# Patient Record
Sex: Female | Born: 1985 | Race: Black or African American | Hispanic: No | Marital: Single | Smoking: Never smoker
Health system: Southern US, Community
[De-identification: ages and names within clinical notes are randomized; demographics above are authoritative.]

## PROBLEM LIST (undated history)

## (undated) DIAGNOSIS — I10 Essential (primary) hypertension: Secondary | ICD-10-CM

## (undated) HISTORY — PX: ABDOMINAL HYSTERECTOMY: SHX81

## (undated) HISTORY — PX: TONSILLECTOMY: SUR1361

## (undated) HISTORY — DX: Essential (primary) hypertension: I10

---

## 2011-09-30 ENCOUNTER — Encounter (HOSPITAL_BASED_OUTPATIENT_CLINIC_OR_DEPARTMENT_OTHER): Payer: Self-pay | Admitting: *Deleted

## 2011-09-30 ENCOUNTER — Emergency Department (INDEPENDENT_AMBULATORY_CARE_PROVIDER_SITE_OTHER): Payer: Self-pay

## 2011-09-30 ENCOUNTER — Emergency Department (HOSPITAL_BASED_OUTPATIENT_CLINIC_OR_DEPARTMENT_OTHER)
Admission: EM | Admit: 2011-09-30 | Discharge: 2011-09-30 | Disposition: A | Discharge status: Home / Self Care | Payer: Self-pay | Attending: Emergency Medicine | Admitting: Emergency Medicine

## 2011-09-30 DIAGNOSIS — R109 Unspecified abdominal pain: Secondary | ICD-10-CM | POA: Insufficient documentation

## 2011-09-30 DIAGNOSIS — E860 Dehydration: Secondary | ICD-10-CM | POA: Insufficient documentation

## 2011-09-30 DIAGNOSIS — K5900 Constipation, unspecified: Secondary | ICD-10-CM | POA: Insufficient documentation

## 2011-09-30 LAB — CBC
MCV: 82.7 fL (ref 78.0–100.0)
Platelets: 296 10*3/uL (ref 150–400)
RBC: 4.68 MIL/uL (ref 3.87–5.11)
RDW: 13.1 % (ref 11.5–15.5)
WBC: 7.1 10*3/uL (ref 4.0–10.5)

## 2011-09-30 LAB — COMPREHENSIVE METABOLIC PANEL
ALT: 8 U/L (ref 0–35)
AST: 16 U/L (ref 0–37)
Albumin: 4 g/dL (ref 3.5–5.2)
CO2: 23 mEq/L (ref 19–32)
Chloride: 103 mEq/L (ref 96–112)
Creatinine, Ser: 0.6 mg/dL (ref 0.50–1.10)
Sodium: 136 mEq/L (ref 135–145)
Total Bilirubin: 0.4 mg/dL (ref 0.3–1.2)

## 2011-09-30 LAB — DIFFERENTIAL
Basophils Relative: 0 % (ref 0–1)
Eosinophils Relative: 2 % (ref 0–5)
Lymphocytes Relative: 35 % (ref 12–46)
Monocytes Absolute: 0.5 10*3/uL (ref 0.1–1.0)
Monocytes Relative: 8 % (ref 3–12)
Neutro Abs: 3.9 10*3/uL (ref 1.7–7.7)

## 2011-09-30 LAB — URINALYSIS, ROUTINE W REFLEX MICROSCOPIC
Leukocytes, UA: NEGATIVE
Nitrite: NEGATIVE
Specific Gravity, Urine: 1.026 (ref 1.005–1.030)
Urobilinogen, UA: 1 mg/dL (ref 0.0–1.0)
pH: 5.5 (ref 5.0–8.0)

## 2011-09-30 LAB — PREGNANCY, URINE: Preg Test, Ur: NEGATIVE

## 2011-09-30 MED ORDER — BISACODYL 5 MG PO TBEC: 5.0000 mg | DELAYED_RELEASE_TABLET | Freq: Two times a day (BID) | ORAL | Status: AC

## 2011-09-30 MED ORDER — SODIUM CHLORIDE 0.9 % IV BOLUS (SEPSIS)
1000.0000 mL | Freq: Once | INTRAVENOUS | Status: AC
  Administered 2011-09-30: 1000 mL via INTRAVENOUS

## 2011-09-30 MED ORDER — GI COCKTAIL ~~LOC~~
30.0000 mL | Freq: Once | ORAL | Status: AC
  Administered 2011-09-30: 30 mL via ORAL
  Filled 2011-09-30: qty 30

## 2011-09-30 MED ORDER — POLYETHYLENE GLYCOL 3350 17 GM/SCOOP PO POWD: 17.0000 g | Freq: Two times a day (BID) | ORAL | Status: AC

## 2011-09-30 MED ORDER — FAMOTIDINE IN NACL 20-0.9 MG/50ML-% IV SOLN
20.0000 mg | Freq: Once | INTRAVENOUS | Status: AC
  Administered 2011-09-30: 20 mg via INTRAVENOUS
  Filled 2011-09-30: qty 50

## 2011-09-30 NOTE — ED Provider Notes (Signed)
History     CSN: [id number]  Arrival date & time 09/30/11  1438   First MD Initiated Contact with Patient 09/30/11 1531      Chief Complaint  Patient presents with  . Abdominal Cramping    (Consider location/radiation/quality/duration/timing/severity/associated sxs/prior treatment) Patient is a 26 y.o. female presenting with cramps. The history is provided by the patient. No language interpreter was used.  Abdominal Cramping The primary symptoms of the illness include abdominal pain. The primary symptoms of the illness do not include fever, fatigue, shortness of breath, nausea, vomiting, diarrhea or dysuria. The current episode started 2 days ago. The onset of the illness was gradual. The problem has been gradually worsening.  The abdominal pain began 2 days ago. The pain came on gradually. The abdominal pain has been gradually worsening since its onset. The abdominal pain is generalized. The abdominal pain does not radiate. The abdominal pain is relieved by nothing. Exacerbated by: nothing.  The patient states that she believes she is currently not pregnant. The patient has had a change in bowel habit. Additional symptoms associated with the illness include constipation. Symptoms associated with the illness do not include chills, anorexia, urgency, frequency or back pain.    History reviewed. No pertinent past medical history.  Past Surgical History  Procedure Date  . Tonsillectomy     History reviewed. No pertinent family history.  History  Substance Use Topics  . Smoking status: Never Smoker   . Smokeless tobacco: Not on file  . Alcohol Use: No    OB History    Grav Para Term Preterm Abortions TAB SAB Ect Mult Living                  Review of Systems  Constitutional: Negative for fever, chills, activity change, appetite change and fatigue.  HENT: Negative for congestion, sore throat, rhinorrhea, neck pain and neck stiffness.   Respiratory: Negative for cough and  shortness of breath.   Cardiovascular: Negative for chest pain and palpitations.  Gastrointestinal: Positive for abdominal pain and constipation. Negative for nausea, vomiting, diarrhea, blood in stool and anorexia.  Genitourinary: Negative for dysuria, urgency, frequency and flank pain.  Musculoskeletal: Negative for myalgias, back pain and arthralgias.  Neurological: Negative for dizziness, weakness, light-headedness, numbness and headaches.  All other systems reviewed and are negative.    Allergies  Review of patient's allergies indicates no known allergies.  Home Medications   Current Outpatient Rx  Name Route Sig Dispense Refill  . IBUPROFEN 200 MG PO TABS Oral Take 400 mg by mouth every 6 (six) hours as needed. Patient used this medication for her stomach pain.    Marland Kitchen BISACODYL 5 MG PO TBEC Oral Take 1 tablet (5 mg total) by mouth 2 (two) times daily. 14 tablet 0  . POLYETHYLENE GLYCOL 3350 PO POWD Oral Take 17 g by mouth 2 (two) times daily. 255 g 0    BP 118/73  Pulse 73  Temp(Src) 98.7 F (37.1 C) (Oral)  Resp 16  Ht 5\' 2"  (1.575 m)  Wt 143 lb (64.864 kg)  BMI 26.15 kg/m2  SpO2 100%  LMP 09/14/2011  Physical Exam  Nursing note and vitals reviewed. Constitutional: She is oriented to person, place, and time. She appears well-developed and well-nourished. No distress.  HENT:  Head: Normocephalic and atraumatic.  Mouth/Throat: Oropharynx is clear and moist. No oropharyngeal exudate.  Eyes: Conjunctivae and EOM are normal. Pupils are equal, round, and reactive to light.  Neck: Normal range  of motion. Neck supple.  Cardiovascular: Normal rate, regular rhythm, normal heart sounds and intact distal pulses.  Exam reveals no gallop and no friction rub.   No murmur heard. Pulmonary/Chest: Effort normal and breath sounds normal. No respiratory distress. She exhibits no tenderness.  Abdominal: Soft. Bowel sounds are normal. She exhibits no distension. There is tenderness  (epigastric tenderness). There is no rebound and no guarding.  Musculoskeletal: Normal range of motion. She exhibits no edema and no tenderness.  Neurological: She is alert and oriented to person, place, and time. No cranial nerve deficit.  Skin: Skin is warm and dry. No rash noted.    ED Course  Procedures (including critical care time)  Labs Reviewed  URINALYSIS, ROUTINE W REFLEX MICROSCOPIC - Abnormal; Notable for the following:    Ketones, ur 40 (*)    All other components within normal limits  PREGNANCY, URINE  CBC  COMPREHENSIVE METABOLIC PANEL  DIFFERENTIAL   Dg Abd 1 View  09/30/2011  *RADIOLOGY REPORT*  Clinical Data: Abdominal cramping for past 2 days.  ABDOMEN - 1 VIEW  Comparison: No priors.  Findings: Gas and stool is seen scattered throughout the colon extending to the level of the rectum.  No pathologic distension of small bowel.  No gross evidence of pneumoperitoneum on this single supine view of the abdomen.  IMPRESSION: 1.  Nonobstructive bowel gas pattern.  No pneumoperitoneum.  Original Report Authenticated By: Florencia Reasons, M.D.     1. Constipation   2. Dehydration, mild       MDM  Will discharge home with stool softeners and miralax.  Instructed to follow up with a primary care physician.  No concern about a malignant cause of pain.  No evidence of obstruction.  Remainder of workup unremarkable.  Did administer some IVF for presence of ketones to suggest mild dehydration        Dayton Bailiff, MD 09/30/11 1649

## 2011-09-30 NOTE — ED Notes (Signed)
Pt c/o abd " cramping " x 2 days, denies n/v/d

## 2011-09-30 NOTE — Discharge Instructions (Signed)

## 2011-11-12 ENCOUNTER — Emergency Department (HOSPITAL_BASED_OUTPATIENT_CLINIC_OR_DEPARTMENT_OTHER)
Admission: EM | Admit: 2011-11-12 | Discharge: 2011-11-12 | Disposition: A | Discharge status: Home / Self Care | Payer: Self-pay | Attending: Emergency Medicine | Admitting: Emergency Medicine

## 2011-11-12 ENCOUNTER — Emergency Department (HOSPITAL_BASED_OUTPATIENT_CLINIC_OR_DEPARTMENT_OTHER): Payer: Self-pay

## 2011-11-12 ENCOUNTER — Encounter (HOSPITAL_BASED_OUTPATIENT_CLINIC_OR_DEPARTMENT_OTHER): Payer: Self-pay | Admitting: *Deleted

## 2011-11-12 DIAGNOSIS — R109 Unspecified abdominal pain: Secondary | ICD-10-CM | POA: Insufficient documentation

## 2011-11-12 DIAGNOSIS — O039 Complete or unspecified spontaneous abortion without complication: Secondary | ICD-10-CM | POA: Insufficient documentation

## 2011-11-12 DIAGNOSIS — N898 Other specified noninflammatory disorders of vagina: Secondary | ICD-10-CM | POA: Insufficient documentation

## 2011-11-12 LAB — URINE MICROSCOPIC-ADD ON

## 2011-11-12 LAB — ABO/RH: ABO/RH(D): O POS

## 2011-11-12 LAB — CBC
MCH: 29.4 pg (ref 26.0–34.0)
MCHC: 35.4 g/dL (ref 30.0–36.0)
Platelets: 293 10*3/uL (ref 150–400)
RDW: 13.1 % (ref 11.5–15.5)

## 2011-11-12 LAB — URINALYSIS, ROUTINE W REFLEX MICROSCOPIC
Bilirubin Urine: NEGATIVE
Glucose, UA: NEGATIVE mg/dL
Specific Gravity, Urine: 1.029 (ref 1.005–1.030)

## 2011-11-12 LAB — WET PREP, GENITAL: Trich, Wet Prep: NONE SEEN

## 2011-11-12 NOTE — Discharge Instructions (Signed)
You should have a repeat BHCG (pregnancy hormone) level checked in the next 2-4 days to see that it's decreasing appropriately as expected with a miscarriage.  If you have increasing abdominal pain, lightheadedness, large increases in bleeding or other concerns please be reevaluated in the emergency department.  Miscarriage (Spontaneous Miscarriage) A miscarriage is when you lose your baby before the twentieth week of pregnancy. Miscarriages happen in 15-20% of pregnancies. Most miscarriages happen in the first 13 weeks of the pregnancy. In medical terms, this is called a spontaneous miscarriage or early pregnancy loss. No further treatment is needed when the miscarriage is complete and all products of conception have been passed out of the body. You can begin trying for another pregnancy as soon as your caregiver says it is okay. CAUSES   Most causes are not known.   Genetic problems like abnormal, not enough or too many chromosomes.   Infection of the cervix or uterus.   An abnormal shaped uterus, fibroid tumors or congenital abnormalities.   Hormone problems.   Medical problems.   Incompetent cervix, the tissue in the cervix is not strong enough to hold the pregnancy.   Smoking, too much alcohol use and illegal drugs.   Trauma.  SYMPTOMS   Bleeding or spotting from the vagina.   Cramping of the lower abdomen.   Passing of fluid from the vagina with or without cramps or pain.   Passing fetal tissue.  TREATMENT   Sometimes no further treatment is necessary if you pass all the tissue in the uterus.   If partial parts of the fetus or placenta remain in the body (incomplete miscarriage), tissue left behind may become infected. Usually a D and C (Dilatation and Curettage) suction or scrapping of the uterus is necessary to remove the remaining tissue in uterus. The procedure is only done when your caregiver knows that there is no chance for the pregnancy to continue. This is  determined by a physical exam, a negative pregnancy test, blood tests and perhaps an ultrasound revealing a dead fetus or no fetus developing because a problem occurred at conception (when the sperm and egg unite).   Medications may be necessary, antibiotics if there is an infection or medications to contract the uterus if there is a lot of bleeding.   If you have Rh negative blood and your partner is Rh positive, you will need a Rho-gam shot (an immune globulin vaccine). This will protect your baby from having Rh blood problems in future pregnancies.  HOME CARE INSTRUCTIONS   Your caregiver may order bed rest (up to the bathroom only). He or she may allow you to continue light activity. You may need to make arrangements for the care of children and for any other responsibilities.   Keep track of the number of pads you use each day and how soaked (saturated) they are. Record this information.   Do not use tampons. Do not douche or have sexual intercourse until approved by your caregiver.   Only take over-the-counter or prescription medicines for pain, discomfort or fever as directed by your caregiver.   Do not take aspirin because it can cause bleeding.   It is very important to keep all follow-up appointments for re-evaluations and continuing management.   Tell your caregiver if you are experiencing domestic violence.   Women who have an Rh negative blood type (i.e., A, B, AB, or O negative) need to receive a drug called Rh(D) immune globulin (RhoGam). This medicine helps protect  future fetuses against problems that can occur if an Rh negative mother is carrying a baby who is Rh positive.   If you and/or your partner are having problems with guilt or grieving, talk to your caregiver or seek counseling to help you cope with the pregnancy loss. Allow enough time to grieve before trying to get pregnant again.  SEEK IMMEDIATE MEDICAL CARE IF:   You have severe cramps or pain in your stomach,  back, or belly (abdomen).   You have a fever.   You pass large clots or tissue. Save any tissue for your caregiver to inspect.   Your bleeding increases.   You become light-headed, weak or have fainting episodes.   You develop chills.  Document Released: 12/02/2000 Document Revised: 05/28/2011 Document Reviewed: 01/09/2008 [facility] Patient Information 2012 [place], [place].

## 2011-11-12 NOTE — ED Provider Notes (Signed)
History     CSN: [id number]  Arrival date & time 11/12/11  1527   First MD Initiated Contact with Patient 11/12/11 [phone]      Chief Complaint  Patient presents with  . Vaginal Bleeding    (Consider location/radiation/quality/duration/timing/severity/associated sxs/prior treatment) HPI Comments: Patient presents for onset of vaginal bleeding yesterday.  Patient states her last menstrual period was the end of March.  A week ago she states she [facility]  She's noted some intermittent spotting over the last few weeks.  Yesterday and today she's had more significant clots and some abdominal cramping.  No dysuria.  No fevers.  No nausea, vomiting or other changes in bowel habits.  Patient is concerned she is having a miscarriage.  This is the patient's first pregnancy.  Patient is a 26 y.o. female presenting with vaginal bleeding. The history is provided by the patient. No language interpreter was used.  Vaginal Bleeding This is a new problem. The current episode started yesterday. Associated symptoms include abdominal pain. Pertinent negatives include no chest pain, no headaches and no shortness of breath.    History reviewed. No pertinent past medical history.  Past Surgical History  Procedure Date  . Tonsillectomy     History reviewed. No pertinent family history.  History  Substance Use Topics  . Smoking status: Never Smoker   . Smokeless tobacco: Not on file  . Alcohol Use: No    OB History    Grav Para Term Preterm Abortions TAB SAB Ect Mult Living                  Review of Systems  Constitutional: Negative.  Negative for fever and chills.  HENT: Negative.   Eyes: Negative.  Negative for discharge and redness.  Respiratory: Negative.  Negative for cough and shortness of breath.   Cardiovascular: Negative.  Negative for chest pain.  Gastrointestinal: Positive for abdominal pain. Negative for nausea, vomiting and diarrhea.    Genitourinary: Positive for vaginal bleeding. Negative for dysuria and vaginal discharge.  Musculoskeletal: Negative.  Negative for back pain.  Skin: Negative.  Negative for color change and rash.  Neurological: Negative.  Negative for syncope and headaches.  Hematological: Negative.  Negative for adenopathy.  Psychiatric/Behavioral: Negative.  Negative for confusion.  All other systems reviewed and are negative.    Allergies  Review of patient's allergies indicates no known allergies.  Home Medications   Current Outpatient Rx  Name Route Sig Dispense Refill  . IBUPROFEN 200 MG PO TABS Oral Take 200 mg by mouth every 6 (six) hours as needed. For pain    . PRENATAL MULTIVITAMIN CH Oral Take 1 tablet by mouth daily.      BP 137/73  Pulse 75  Temp(Src) 98.1 F (36.7 C) (Oral)  Resp 18  SpO2 100%  LMP 09/12/2011  Physical Exam  Nursing note and vitals reviewed. Constitutional: She is oriented to person, place, and time. She appears well-developed and well-nourished.  Non-toxic appearance. She does not have a sickly appearance.  HENT:  Head: Normocephalic and atraumatic.  Eyes: Conjunctivae, EOM and lids are normal. Pupils are equal, round, and reactive to light. No scleral icterus.  Neck: Trachea normal and normal range of motion. Neck supple.  Cardiovascular: Normal rate, regular rhythm and normal heart sounds.   Pulmonary/Chest: Effort normal and breath sounds normal. No respiratory distress. She has no wheezes. She has no rales.  Abdominal: Soft. Normal appearance. There is no  tenderness. There is no rebound, no guarding and no CVA tenderness.  Genitourinary: No vaginal discharge found.       Examination chaperoned. Normal external genitalia.  Patient does have a small amount of blood in the vaginal vault.  No other vaginal discharge present.  Closed cervical os.  No cervical motion tenderness or adnexal tenderness on exam.   Musculoskeletal: Normal range of motion.   Neurological: She is alert and oriented to person, place, and time. She has normal strength.  Skin: Skin is warm, dry and intact. No rash noted.  Psychiatric: She has a normal mood and affect. Her behavior is normal. Judgment and thought content normal.    ED Course  Procedures (including critical care time)  Results for orders placed during the hospital encounter of 11/12/11  PREGNANCY, URINE      Component Value Range   Preg Test, Ur POSITIVE (*) NEGATIVE   URINALYSIS, ROUTINE W REFLEX MICROSCOPIC      Component Value Range   Color, Urine AMBER (*) YELLOW    APPearance CLOUDY (*) CLEAR    Specific Gravity, Urine 1.029  1.005 - 1.030    pH 5.5  5.0 - 8.0    Glucose, UA NEGATIVE  NEGATIVE (mg/dL)   Hgb urine dipstick LARGE (*) NEGATIVE    Bilirubin Urine NEGATIVE  NEGATIVE    Ketones, ur 15 (*) NEGATIVE (mg/dL)   Protein, ur 409 (*) NEGATIVE (mg/dL)   Urobilinogen, UA 1.0  0.0 - 1.0 (mg/dL)   Nitrite POSITIVE (*) NEGATIVE    Leukocytes, UA MODERATE (*) NEGATIVE   WET PREP, GENITAL      Component Value Range   Yeast Wet Prep HPF POC NONE SEEN  NONE SEEN    Trich, Wet Prep NONE SEEN  NONE SEEN    Clue Cells Wet Prep HPF POC MODERATE (*) NONE SEEN    WBC, Wet Prep HPF POC FEW (*) NONE SEEN   CBC      Component Value Range   WBC 6.1  4.0 - 10.5 (K/uL)   RBC 4.29  3.87 - 5.11 (MIL/uL)   Hemoglobin 12.6  12.0 - 15.0 (g/dL)   HCT 81.1 (*) 91.4 - 46.0 (%)   MCV 83.0  78.0 - 100.0 (fL)   MCH 29.4  26.0 - 34.0 (pg)   MCHC 35.4  30.0 - 36.0 (g/dL)   RDW 78.2  95.6 - 21.3 (%)   Platelets 293  150 - 400 (K/uL)  ABO/RH      Component Value Range   ABO/RH(D) O POS     No rh immune globuloin NOT A RH IMMUNE GLOBULIN CANDIDATE, PT RH POSITIVE    HCG, QUANTITATIVE, PREGNANCY      Component Value Range   hCG, Beta Chain, Quant, S 2232 (*) <5 (mIU/mL)  URINE MICROSCOPIC-ADD ON      Component Value Range   Squamous Epithelial / LPF MANY (*) RARE    WBC, UA 11-20  <3 (WBC/hpf)   RBC  / HPF TOO NUMEROUS TO COUNT  <3 (RBC/hpf)   Bacteria, UA MANY (*) RARE    Urine-Other MUCOUS PRESENT     US Ob Comp Less 14 Wks  11/12/2011  *RADIOLOGY REPORT*  Clinical Data: 26 year old G1, LMP 09/12/2011 (a week 5 days), presenting with vaginal bleeding including clots over the past 2 days.  Positive urine pregnancy test.  OBSTETRIC <14 WK [place] AND TRANSVAGINAL OB US  Technique:  Both transabdominal and transvaginal ultrasound examinations were performed for complete  evaluation of the gestation as well as the maternal uterus, adnexal regions, and pelvic cul-de-sac.  Transvaginal technique was performed to assess early pregnancy.  Comparison:  None.  Intrauterine gestational sac:  Not visualized. Yolk sac: Not visualized. Embryo: Not visualized. Cardiac Activity: Not visualized. Heart Rate: Not applicable.  MSD: Not applicable. CRL: Not applicable. [place] EDC: Not applicable.  Maternal uterus/adnexae: Normal appearing uterus.  Normal-appearing trilaminar endometrium. Echogenic fluid in the fundal endometrial canal.  Right ovary normal in size measuring approximately 2.9 x 1.6 x 2.1 cm, containing small follicular cysts.  Left ovary normal in size measuring 3.1 x 2.1 x 2.1 cm, containing an approximate 1.2 x 0.9 x 0.9 cm simple cyst and a smaller complex cyst.  Normal color Doppler flow in both ovaries.  No adnexal masses or free pelvic fluid.  IMPRESSION:  1.  No visible intrauterine gestational sac and no evidence of adnexal ectopic pregnancy.  Findings are compatible with completed spontaneous abortion, inaccurate dates with a very early gestation such that the sac was not yet visualized, or inapparent ectopic. Correlation with beta HCG and follow-up ultrasound may help with this determination. 2.  Normal-appearing ovaries.  No adnexal masses or free fluid. 3.  Small amount of blood in the fundal endometrial canal.  Original Report Authenticated By: Arnell Sieving, M.D.   US Ob Transvaginal  11/12/2011   *RADIOLOGY REPORT*  Clinical Data: 26 year old G1, LMP 09/12/2011 (a week 5 days), presenting with vaginal bleeding including clots over the past 2 days.  Positive urine pregnancy test.  OBSTETRIC <14 WK [place] AND TRANSVAGINAL OB US  Technique:  Both transabdominal and transvaginal ultrasound examinations were performed for complete evaluation of the gestation as well as the maternal uterus, adnexal regions, and pelvic cul-de-sac.  Transvaginal technique was performed to assess early pregnancy.  Comparison:  None.  Intrauterine gestational sac:  Not visualized. Yolk sac: Not visualized. Embryo: Not visualized. Cardiac Activity: Not visualized. Heart Rate: Not applicable.  MSD: Not applicable. CRL: Not applicable. [place] EDC: Not applicable.  Maternal uterus/adnexae: Normal appearing uterus.  Normal-appearing trilaminar endometrium. Echogenic fluid in the fundal endometrial canal.  Right ovary normal in size measuring approximately 2.9 x 1.6 x 2.1 cm, containing small follicular cysts.  Left ovary normal in size measuring 3.1 x 2.1 x 2.1 cm, containing an approximate 1.2 x 0.9 x 0.9 cm simple cyst and a smaller complex cyst.  Normal color Doppler flow in both ovaries.  No adnexal masses or free pelvic fluid.  IMPRESSION:  1.  No visible intrauterine gestational sac and no evidence of adnexal ectopic pregnancy.  Findings are compatible with completed spontaneous abortion, inaccurate dates with a very early gestation such that the sac was not yet visualized, or inapparent ectopic. Correlation with beta HCG and follow-up ultrasound may help with this determination. 2.  Normal-appearing ovaries.  No adnexal masses or free fluid. 3.  Small amount of blood in the fundal endometrial canal.  Original Report Authenticated By: Arnell Sieving, M.D.      MDM  Patient with likely spontaneous abortion.  Patient does understand that there is a small possibility of ectopic or early pregnancy.  I've advised the patient that she  needs GYN followup in next 2-4 days for repeat beta quantitative level and possible ultrasound.  She understands this at time of discharge.  Patient is otherwise hemodynamically stable and has a soft abdomen consistent with the ultrasound showing no fluid.  I believe the patient is safe for discharge home.  She's been given precautions to return for worsening pain, lightheadedness, large increase in bleeding.        Nat Christen, MD 11/12/11 [phone]

## 2011-11-12 NOTE — ED Notes (Signed)
[redacted] weeks pregnant. Vaginal bleeding since yesterday and abdominal cramps. Thinks she had a miscarriage.

## 2011-11-14 LAB — GC/CHLAMYDIA PROBE AMP, GENITAL
Chlamydia, DNA Probe: NEGATIVE
GC Probe Amp, Genital: NEGATIVE

## 2012-07-24 ENCOUNTER — Encounter (HOSPITAL_BASED_OUTPATIENT_CLINIC_OR_DEPARTMENT_OTHER): Payer: Self-pay

## 2012-07-24 ENCOUNTER — Emergency Department (HOSPITAL_BASED_OUTPATIENT_CLINIC_OR_DEPARTMENT_OTHER)
Admission: EM | Admit: 2012-07-24 | Discharge: 2012-07-24 | Disposition: A | Discharge status: Home / Self Care | Payer: Medicaid Other | Attending: Emergency Medicine | Admitting: Emergency Medicine

## 2012-07-24 DIAGNOSIS — R109 Unspecified abdominal pain: Secondary | ICD-10-CM | POA: Insufficient documentation

## 2012-07-24 DIAGNOSIS — Z3202 Encounter for pregnancy test, result negative: Secondary | ICD-10-CM | POA: Insufficient documentation

## 2012-07-24 DIAGNOSIS — N390 Urinary tract infection, site not specified: Secondary | ICD-10-CM | POA: Insufficient documentation

## 2012-07-24 LAB — URINALYSIS, ROUTINE W REFLEX MICROSCOPIC
Bilirubin Urine: NEGATIVE
Nitrite: POSITIVE — AB
Specific Gravity, Urine: 1.023 (ref 1.005–1.030)
Urobilinogen, UA: 1 mg/dL (ref 0.0–1.0)

## 2012-07-24 LAB — PREGNANCY, URINE: Preg Test, Ur: NEGATIVE

## 2012-07-24 LAB — URINE MICROSCOPIC-ADD ON

## 2012-07-24 MED ORDER — CEPHALEXIN 250 MG PO CAPS
500.0000 mg | ORAL_CAPSULE | Freq: Once | ORAL | Status: AC
  Administered 2012-07-24: 500 mg via ORAL
  Filled 2012-07-24: qty 2

## 2012-07-24 MED ORDER — CEPHALEXIN 500 MG PO CAPS: 500.0000 mg | ORAL_CAPSULE | Freq: Two times a day (BID) | ORAL | Status: DC

## 2012-07-24 MED ORDER — PHENAZOPYRIDINE HCL 100 MG PO TABS
200.0000 mg | ORAL_TABLET | Freq: Once | ORAL | Status: AC
  Administered 2012-07-24: 200 mg via ORAL
  Filled 2012-07-24: qty 2

## 2012-07-24 MED ORDER — PHENAZOPYRIDINE HCL 200 MG PO TABS: 200.0000 mg | ORAL_TABLET | Freq: Three times a day (TID) | ORAL | Status: DC

## 2012-07-24 NOTE — ED Provider Notes (Signed)
History     CSN: [id number]  Arrival date & time 07/24/12  1154   First MD Initiated Contact with Patient 07/24/12 1210      Chief Complaint  Patient presents with  . Urinary Frequency    (Consider location/radiation/quality/duration/timing/severity/associated sxs/prior treatment) HPI Tracey Elliott is a 27 y.o. female who presents with complaint of urinary frequency for 2 days. States has to go urinate every 10 min with just small amount of urine out. States has burning in her abdomen only with urination, denies fever, chills, malaise, urinary urgency, cloudy urine. Denies any vaginal discharge or bleeding. States just finished her period, does not think she is pregnant. Pt states no abdominal pain, unless urinating. No n/v.    History reviewed. No pertinent past medical history.  Past Surgical History  Procedure Date  . Tonsillectomy     History reviewed. No pertinent family history.  History  Substance Use Topics  . Smoking status: Never Smoker   . Smokeless tobacco: Not on file  . Alcohol Use: No    OB History    Grav Para Term Preterm Abortions TAB SAB Ect Mult Living                  Review of Systems  Constitutional: Negative for fever and chills.  Respiratory: Negative.   Cardiovascular: Negative.   Gastrointestinal: Positive for abdominal pain. Negative for nausea, vomiting and diarrhea.  Genitourinary: Negative for dysuria, urgency, frequency, hematuria, flank pain, vaginal bleeding, vaginal discharge, difficulty urinating and genital sores.  Musculoskeletal: Negative.   Skin: Negative.     Allergies  Review of patient's allergies indicates no known allergies.  Home Medications   Current Outpatient Rx  Name  Route  Sig  Dispense  Refill  . IBUPROFEN 200 MG PO TABS   Oral   Take 400 mg by mouth every 6 (six) hours as needed. For pain         . PRENATAL MULTIVITAMIN CH   Oral   Take 1 tablet by mouth daily.           BP 131/89  Pulse 66   Temp 98 F (36.7 C) (Oral)  Resp 18  Ht 5\' 2"  (1.575 m)  Wt 150 lb (68.04 kg)  BMI 27.44 kg/m2  SpO2 100%  LMP 07/20/2012  Physical Exam  Nursing note and vitals reviewed. Constitutional: She appears well-developed and well-nourished. No distress.  Eyes: Conjunctivae normal are normal.  Neck: Neck supple.  Cardiovascular: Normal rate, regular rhythm and normal heart sounds.   Pulmonary/Chest: Effort normal and breath sounds normal. No respiratory distress. She has no wheezes. She has no rales.  Abdominal: Soft. Bowel sounds are normal. She exhibits no distension. There is no tenderness. There is no rebound.  Neurological: She is alert.  Skin: Skin is warm and dry.    ED Course  Procedures (including critical care time) Pt with urinary frequency, no abdominal tenderness on exam. Some pain with urination. Will get UA, urine preg. Pt has no vaginal symptoms and not wanting pelvic exam done unless urine comes back normal.  ' Results for orders placed during the hospital encounter of 07/24/12  PREGNANCY, URINE      Component Value Range   Preg Test, Ur NEGATIVE  NEGATIVE  URINALYSIS, ROUTINE W REFLEX MICROSCOPIC      Component Value Range   Color, Urine YELLOW  YELLOW   APPearance CLOUDY (*) CLEAR   Specific Gravity, Urine 1.023  1.005 - 1.030  pH 5.5  5.0 - 8.0   Glucose, UA NEGATIVE  NEGATIVE mg/dL   Hgb urine dipstick LARGE (*) NEGATIVE   Bilirubin Urine NEGATIVE  NEGATIVE   Ketones, ur NEGATIVE  NEGATIVE mg/dL   Protein, ur 960 (*) NEGATIVE mg/dL   Urobilinogen, UA 1.0  0.0 - 1.0 mg/dL   Nitrite POSITIVE (*) NEGATIVE   Leukocytes, UA MODERATE (*) NEGATIVE  URINE MICROSCOPIC-ADD ON      Component Value Range   Squamous Epithelial / LPF MANY (*) RARE   WBC, UA TOO NUMEROUS TO COUNT  <3 WBC/hpf   RBC / HPF TOO NUMEROUS TO COUNT  <3 RBC/hpf   Bacteria, UA MANY (*) RARE   No results found.    1. UTI (lower urinary tract infection)       MDM  TNTC WBCs, many  bacteria, positive ntirite and leukocyte esterase on UA. Will start on keflex 500mg  BID, will try pyridium for symptoms. First doses given in ED. Will d/c home with follow up. Return if worsening. No flank pain, pt afebrile, doubt pyelonephritis.    Filed Vitals:   07/24/12 1208 07/24/12 1209  BP:  131/89  Pulse:  66  Temp:  98 F (36.7 C)  TempSrc:  Oral  Resp:  18  Height: 5\' 2"  (1.575 m) 5\' 2"  (1.575 m)  Weight: 150 lb (68.04 kg) 150 lb (68.04 kg)  SpO2:  100%        Lottie Mussel, PA 07/24/12 1650

## 2012-07-24 NOTE — ED Notes (Signed)
Pt states that she has urinary frequency and lower abdominal pain.  Pt states that she does not have dysuria, fever, chills, generalized malaise or fatigue.  No other s/sx noted.

## 2012-07-25 NOTE — ED Provider Notes (Signed)
Medical screening examination/treatment/procedure(s) were performed by non-physician practitioner and as supervising physician I was immediately available for consultation/collaboration.    Ceana Fiala L Jasleen Riepe, MD 07/25/12 2229 

## 2012-07-26 LAB — URINE CULTURE

## 2012-07-27 NOTE — ED Notes (Signed)
+   Urine Patient treated with Keflex-sensitive to same-chart appended per protocol MD. 

## 2012-08-16 ENCOUNTER — Emergency Department (HOSPITAL_BASED_OUTPATIENT_CLINIC_OR_DEPARTMENT_OTHER): Payer: Medicaid Other

## 2012-08-16 ENCOUNTER — Encounter (HOSPITAL_BASED_OUTPATIENT_CLINIC_OR_DEPARTMENT_OTHER): Payer: Self-pay | Admitting: *Deleted

## 2012-08-16 ENCOUNTER — Emergency Department (HOSPITAL_BASED_OUTPATIENT_CLINIC_OR_DEPARTMENT_OTHER)
Admission: EM | Admit: 2012-08-16 | Discharge: 2012-08-16 | Disposition: A | Discharge status: Home / Self Care | Payer: Medicaid Other | Attending: Emergency Medicine | Admitting: Emergency Medicine

## 2012-08-16 DIAGNOSIS — Z3202 Encounter for pregnancy test, result negative: Secondary | ICD-10-CM | POA: Insufficient documentation

## 2012-08-16 DIAGNOSIS — R071 Chest pain on breathing: Secondary | ICD-10-CM | POA: Insufficient documentation

## 2012-08-16 LAB — URINALYSIS, ROUTINE W REFLEX MICROSCOPIC
Bilirubin Urine: NEGATIVE
Glucose, UA: NEGATIVE mg/dL
Ketones, ur: NEGATIVE mg/dL
Leukocytes, UA: NEGATIVE
Protein, ur: NEGATIVE mg/dL

## 2012-08-16 MED ORDER — IBUPROFEN 800 MG PO TABS: 800.0000 mg | ORAL_TABLET | Freq: Three times a day (TID) | ORAL | Status: DC

## 2012-08-16 MED ORDER — HYDROCODONE-ACETAMINOPHEN 5-325 MG PO TABS: 2.0000 | ORAL_TABLET | ORAL | Status: DC | PRN

## 2012-08-16 NOTE — ED Provider Notes (Signed)
History     CSN: [id number]  Arrival date & time 08/16/12  1517   First MD Initiated Contact with Patient 08/16/12 1734      Chief Complaint  Patient presents with  . Chest Pain    (Consider location/radiation/quality/duration/timing/severity/associated sxs/prior treatment) Patient is a 27 y.o. female presenting with chest pain. The history is provided by the patient. No language interpreter was used.  Chest Pain Pain location:  L chest Pain quality: sharp and stabbing   Pain radiates to:  Does not radiate Pain radiates to the back: no   Pain severity:  No pain Onset quality:  Sudden Duration:  1 minute Progression:  Unchanged Chronicity:  New Relieved by:  Nothing Worsened by:  Nothing tried Associated symptoms: no abdominal pain, no back pain, no dizziness, no lower extremity edema and no shortness of breath   Risk factors: no birth control     History reviewed. No pertinent past medical history.  Past Surgical History  Procedure Laterality Date  . Tonsillectomy      History reviewed. No pertinent family history.  History  Substance Use Topics  . Smoking status: Never Smoker   . Smokeless tobacco: Not on file  . Alcohol Use: No    OB History   Grav Para Term Preterm Abortions TAB SAB Ect Mult Living                  Review of Systems  Respiratory: Negative for shortness of breath.   Cardiovascular: Positive for chest pain.  Gastrointestinal: Negative for abdominal pain.  Musculoskeletal: Negative for back pain.  Neurological: Negative for dizziness.  All other systems reviewed and are negative.    Allergies  Review of patient's allergies indicates no known allergies.  Home Medications   Current Outpatient Rx  Name  Route  Sig  Dispense  Refill  . cephALEXin (KEFLEX) 500 MG capsule   Oral   Take 1 capsule (500 mg total) by mouth 2 (two) times daily.   20 capsule   0   . ibuprofen (ADVIL,MOTRIN) 200 MG tablet   Oral   Take 400 mg by mouth  every 6 (six) hours as needed. For pain         . phenazopyridine (PYRIDIUM) 200 MG tablet   Oral   Take 1 tablet (200 mg total) by mouth 3 (three) times daily.   6 tablet   0   . Prenatal Vit-Fe Fumarate-FA (PRENATAL MULTIVITAMIN) TABS   Oral   Take 1 tablet by mouth daily.           BP 121/51  Pulse 63  Temp(Src) 97.9 F (36.6 C) (Oral)  Resp 16  Ht 5\' 2"  (1.575 m)  Wt 160 lb (72.576 kg)  BMI 29.26 kg/m2  SpO2 100%  LMP 07/20/2012  Physical Exam  Nursing note and vitals reviewed. Constitutional: She is oriented to person, place, and time. She appears well-developed and well-nourished.  HENT:  Head: Normocephalic.  Right Ear: External ear normal.  Left Ear: External ear normal.  Eyes: Conjunctivae are normal. Pupils are equal, round, and reactive to light.  Neck: Normal range of motion.  Cardiovascular: Normal rate and regular rhythm.   Pulmonary/Chest: Effort normal and breath sounds normal.  Abdominal: Soft. Bowel sounds are normal.  Musculoskeletal: Normal range of motion.  Neurological: She is alert and oriented to person, place, and time. She has normal reflexes.  Skin: Skin is warm.  Psychiatric: She has a normal mood and affect.  ED Course  Procedures (including critical care time)  Labs Reviewed  URINALYSIS, ROUTINE W REFLEX MICROSCOPIC - Abnormal; Notable for the following:    APPearance CLOUDY (*)    Specific Gravity, Urine 1.033 (*)    All other components within normal limits  PREGNANCY, URINE   Dg Chest 2 View  08/16/2012  *RADIOLOGY REPORT*  Clinical Data: 27 year old female with chest pain.  CHEST - 2 VIEW  Comparison: None  Findings: The cardiomediastinal silhouette is unremarkable. The lungs are clear. There is no evidence of focal airspace disease, pulmonary edema, suspicious pulmonary nodule/mass, pleural effusion, or pneumothorax. No acute bony abnormalities are identified.  IMPRESSION: No evidence of active cardiopulmonary disease.    Original Report Authenticated By: Harmon Pier, M.D.      1. Chest wall pain       Date: 08/16/2012  Rate: 74  Rhythm: normal sinus rhythm  QRS Axis: normal  Intervals: normal  ST/T Wave abnormalities: normal  Conduction Disutrbances:none  Narrative Interpretation:   Old EKG Reviewed: none available  MDM  Chest xray normal,  Pt given rx for hydrocodone and ibuprofen.   Pt advised to follow up with her MD for recheck.         Lonia Skinner [place], [place] 08/16/12 2032  Lonia Skinner [place], [place] 08/16/12 2032

## 2012-08-16 NOTE — ED Notes (Signed)
Pt c/o left side chest pain x 4 days " off and on" denies n/v or SOB, pt also c/o left lower abd pain x 1 day

## 2012-08-17 NOTE — ED Provider Notes (Signed)
Medical screening examination/treatment/procedure(s) were performed by non-physician practitioner and as supervising physician I was immediately available for consultation/collaboration.   Carleene Cooper III, MD 08/17/12 [phone]

## 2012-12-25 ENCOUNTER — Encounter (HOSPITAL_BASED_OUTPATIENT_CLINIC_OR_DEPARTMENT_OTHER): Payer: Self-pay | Admitting: *Deleted

## 2012-12-25 ENCOUNTER — Emergency Department (HOSPITAL_BASED_OUTPATIENT_CLINIC_OR_DEPARTMENT_OTHER)
Admission: EM | Admit: 2012-12-25 | Discharge: 2012-12-25 | Disposition: A | Discharge status: Home / Self Care | Payer: Self-pay | Attending: Emergency Medicine | Admitting: Emergency Medicine

## 2012-12-25 DIAGNOSIS — L239 Allergic contact dermatitis, unspecified cause: Secondary | ICD-10-CM

## 2012-12-25 DIAGNOSIS — L089 Local infection of the skin and subcutaneous tissue, unspecified: Secondary | ICD-10-CM | POA: Insufficient documentation

## 2012-12-25 DIAGNOSIS — IMO0002 Reserved for concepts with insufficient information to code with codable children: Secondary | ICD-10-CM | POA: Insufficient documentation

## 2012-12-25 DIAGNOSIS — Y9289 Other specified places as the place of occurrence of the external cause: Secondary | ICD-10-CM | POA: Insufficient documentation

## 2012-12-25 DIAGNOSIS — Y939 Activity, unspecified: Secondary | ICD-10-CM | POA: Insufficient documentation

## 2012-12-25 DIAGNOSIS — L299 Pruritus, unspecified: Secondary | ICD-10-CM | POA: Insufficient documentation

## 2012-12-25 DIAGNOSIS — L259 Unspecified contact dermatitis, unspecified cause: Secondary | ICD-10-CM | POA: Insufficient documentation

## 2012-12-25 MED ORDER — PREDNISONE 10 MG PO TABS: 20.0000 mg | ORAL_TABLET | Freq: Two times a day (BID) | ORAL | Status: DC

## 2012-12-25 MED ORDER — SULFAMETHOXAZOLE-TRIMETHOPRIM 800-160 MG PO TABS: 1.0000 | ORAL_TABLET | Freq: Two times a day (BID) | ORAL | Status: DC

## 2012-12-25 NOTE — ED Provider Notes (Signed)
History    CSN: [id number] Arrival date & time 12/25/12  1120  First MD Initiated Contact with Patient 12/25/12 1232     Chief Complaint  Patient presents with  . Insect Bite   (Consider location/radiation/quality/duration/timing/severity/associated sxs/prior Treatment) HPI Tracey Elliott is a 27 y.o. female who presents to the ED with a rash on her lower legs. Right leg with lateral blister like areas. Left with one vesicular area inner aspect. Redness surrounding. She was outside and not sure if got in poison oak or if insect bites. The area itch and burn. She first noted them yesterday. Her family member cleaned them with peroxide and this morning the areas are red and burning and the skin feels tight. She denies fever, chills, nausea or vomiting.  History reviewed. No pertinent past medical history. Past Surgical History  Procedure Laterality Date  . Tonsillectomy     No family history on file. History  Substance Use Topics  . Smoking status: Never Smoker   . Smokeless tobacco: Not on file  . Alcohol Use: No   OB History   Grav Para Term Preterm Abortions TAB SAB Ect Mult Living                 Review of Systems  Constitutional: Negative for fever and chills.  HENT: Negative for sore throat.   Eyes: Negative for visual disturbance.  Respiratory: Negative for shortness of breath.   Gastrointestinal: Negative for nausea, vomiting and abdominal pain.  Skin: Positive for rash and wound.  Neurological: Negative for dizziness and headaches.  Psychiatric/Behavioral: The patient is not nervous/anxious.     Allergies  Review of patient's allergies indicates no known allergies.  Home Medications   Current Outpatient Rx  Name  Route  Sig  Dispense  Refill  . cephALEXin (KEFLEX) 500 MG capsule   Oral   Take 1 capsule (500 mg total) by mouth 2 (two) times daily.   20 capsule   0   . HYDROcodone-acetaminophen (NORCO/VICODIN) 5-325 MG per tablet   Oral   Take 2 tablets  by mouth every 4 (four) hours as needed for pain.   10 tablet   0   . ibuprofen (ADVIL,MOTRIN) 200 MG tablet   Oral   Take 400 mg by mouth every 6 (six) hours as needed. For pain         . ibuprofen (ADVIL,MOTRIN) 800 MG tablet   Oral   Take 1 tablet (800 mg total) by mouth 3 (three) times daily.   21 tablet   0   . phenazopyridine (PYRIDIUM) 200 MG tablet   Oral   Take 1 tablet (200 mg total) by mouth 3 (three) times daily.   6 tablet   0   . Prenatal Vit-Fe Fumarate-FA (PRENATAL MULTIVITAMIN) TABS   Oral   Take 1 tablet by mouth daily.          BP 109/70  Pulse 81  Temp(Src) 98.4 F (36.9 C) (Oral)  Resp 20  SpO2 100% Physical Exam  Nursing note and vitals reviewed. Constitutional: She is oriented to person, place, and time. She appears well-developed and well-nourished. No distress.  HENT:  Head: Normocephalic.  Eyes: EOM are normal.  Neck: Normal range of motion. Neck supple.  Cardiovascular: Normal rate.   Pulmonary/Chest: Effort normal.  Musculoskeletal:  Left lower leg with 0.5 cm vesicular lesion noted on the inner aspect with surrounding erythema.  Right lower leg with linear vesicular lesions with surrounding erythema and swelling.  Pedal pulses present, adequate circulation, good touch sensation.    Neurological: She is alert and oriented to person, place, and time. No cranial nerve deficit.  Skin: Skin is warm and dry.  Psychiatric: She has a normal mood and affect. Her behavior is normal.    ED Course  Procedures (including critical care time)  MDM  27 y.o. female with allergic contact dermitis and secondary skin infection. Will treat with antibiotics and prednisone. She will take benadryl as needed for itching.  Discussed with the patient clinical findings and plan of care and all questioned fully answered. She will return if any problems arise.    Medication List    STOP taking these medications       cephALEXin 500 MG capsule  Commonly  known as:  KEFLEX     HYDROcodone-acetaminophen 5-325 MG per tablet  Commonly known as:  NORCO/VICODIN     phenazopyridine 200 MG tablet  Commonly known as:  PYRIDIUM      TAKE these medications       predniSONE 10 MG tablet  Commonly known as:  DELTASONE  Take 2 tablets (20 mg total) by mouth 2 (two) times daily.     sulfamethoxazole-trimethoprim 800-160 MG per tablet  Commonly known as:  SEPTRA DS  Take 1 tablet by mouth 2 (two) times daily.      ASK your doctor about these medications       ibuprofen 200 MG tablet  Commonly known as:  ADVIL,MOTRIN  Take 400 mg by mouth every 6 (six) hours as needed. For pain  Ask about: Which instructions should I use?     ibuprofen 800 MG tablet  Commonly known as:  ADVIL,MOTRIN  Take 1 tablet (800 mg total) by mouth 3 (three) times daily.  Ask about: Which instructions should I use?     prenatal multivitamin Tabs  Take 1 tablet by mouth daily.         [facility] Orlene Och, NP 12/25/12 1300

## 2012-12-25 NOTE — ED Notes (Signed)
Patient has small raised bumps to both lower legs. Reddened and swollen. Thinks she was bitten by bugs yesterday.

## 2012-12-25 NOTE — ED Provider Notes (Signed)
Medical screening examination/treatment/procedure(s) were performed by non-physician practitioner and as supervising physician I was immediately available for consultation/collaboration.   [place] Angelita Harnack, MD 12/25/12 1856 

## 2013-07-20 ENCOUNTER — Encounter (HOSPITAL_BASED_OUTPATIENT_CLINIC_OR_DEPARTMENT_OTHER): Payer: Self-pay | Admitting: Emergency Medicine

## 2013-07-20 ENCOUNTER — Emergency Department (HOSPITAL_BASED_OUTPATIENT_CLINIC_OR_DEPARTMENT_OTHER)
Admission: EM | Admit: 2013-07-20 | Discharge: 2013-07-20 | Disposition: A | Discharge status: Home / Self Care | Payer: Medicaid Other | Attending: Emergency Medicine | Admitting: Emergency Medicine

## 2013-07-20 DIAGNOSIS — N390 Urinary tract infection, site not specified: Secondary | ICD-10-CM | POA: Insufficient documentation

## 2013-07-20 DIAGNOSIS — B9689 Other specified bacterial agents as the cause of diseases classified elsewhere: Secondary | ICD-10-CM | POA: Insufficient documentation

## 2013-07-20 DIAGNOSIS — N760 Acute vaginitis: Secondary | ICD-10-CM | POA: Insufficient documentation

## 2013-07-20 DIAGNOSIS — IMO0002 Reserved for concepts with insufficient information to code with codable children: Secondary | ICD-10-CM | POA: Insufficient documentation

## 2013-07-20 DIAGNOSIS — Z79899 Other long term (current) drug therapy: Secondary | ICD-10-CM | POA: Insufficient documentation

## 2013-07-20 DIAGNOSIS — A499 Bacterial infection, unspecified: Secondary | ICD-10-CM | POA: Insufficient documentation

## 2013-07-20 DIAGNOSIS — Z3202 Encounter for pregnancy test, result negative: Secondary | ICD-10-CM | POA: Insufficient documentation

## 2013-07-20 LAB — WET PREP, GENITAL
Trich, Wet Prep: NONE SEEN
Yeast Wet Prep HPF POC: NONE SEEN

## 2013-07-20 LAB — URINALYSIS, ROUTINE W REFLEX MICROSCOPIC
Bilirubin Urine: NEGATIVE
Glucose, UA: NEGATIVE mg/dL
KETONES UR: NEGATIVE mg/dL
NITRITE: NEGATIVE
PH: 8 (ref 5.0–8.0)
PROTEIN: 100 mg/dL — AB
Specific Gravity, Urine: 1.028 (ref 1.005–1.030)
Urobilinogen, UA: 1 mg/dL (ref 0.0–1.0)

## 2013-07-20 LAB — PREGNANCY, URINE: Preg Test, Ur: NEGATIVE

## 2013-07-20 LAB — URINE MICROSCOPIC-ADD ON

## 2013-07-20 MED ORDER — METRONIDAZOLE 500 MG PO TABS: 500.0000 mg | ORAL_TABLET | Freq: Two times a day (BID) | ORAL | Status: DC

## 2013-07-20 MED ORDER — SULFAMETHOXAZOLE-TRIMETHOPRIM 800-160 MG PO TABS: 1.0000 | ORAL_TABLET | Freq: Two times a day (BID) | ORAL | Status: AC

## 2013-07-20 MED ORDER — PHENAZOPYRIDINE HCL 200 MG PO TABS: 200.0000 mg | ORAL_TABLET | Freq: Three times a day (TID) | ORAL | Status: DC

## 2013-07-20 NOTE — ED Provider Notes (Signed)
CSN: [id number]     Arrival date & time 07/20/13  1304 History   First MD Initiated Contact with Patient 07/20/13 1344     Chief Complaint  Patient presents with  . Urinary Frequency   (Consider location/radiation/quality/duration/timing/severity/associated sxs/prior Treatment) Patient is a 28 y.o. female presenting with frequency. The history is provided by the patient.  Urinary Frequency This is a new problem. The current episode started yesterday. The problem has been gradually worsening. Pertinent negatives include no abdominal pain, anorexia, chills, coughing, fever, headaches, nausea, rash or vomiting. Nothing aggravates the symptoms. She has tried drinking for the symptoms. The treatment provided no relief.    History reviewed. No pertinent past medical history. Past Surgical History  Procedure Laterality Date  . Tonsillectomy     No family history on file. History  Substance Use Topics  . Smoking status: Never Smoker   . Smokeless tobacco: Not on file  . Alcohol Use: No   OB History   Grav Para Term Preterm Abortions TAB SAB Ect Mult Living                 Review of Systems  Constitutional: Negative for fever and chills.  HENT: Negative.   Respiratory: Negative for cough.   Gastrointestinal: Negative for nausea, vomiting, abdominal pain and anorexia.  Genitourinary: Positive for dysuria, urgency and frequency.  Musculoskeletal: Negative for back pain.  Skin: Negative for rash.  Neurological: Negative for headaches.  Psychiatric/Behavioral: The patient is not nervous/anxious.     Allergies  Review of patient's allergies indicates no known allergies.  Home Medications   Current Outpatient Rx  Name  Route  Sig  Dispense  Refill  . ibuprofen (ADVIL,MOTRIN) 200 MG tablet   Oral   Take 400 mg by mouth every 6 (six) hours as needed. For pain         . predniSONE (DELTASONE) 10 MG tablet   Oral   Take 2 tablets (20 mg total) by mouth 2 (two) times daily.   20  tablet   0   . Prenatal Vit-Fe Fumarate-FA (PRENATAL MULTIVITAMIN) TABS   Oral   Take 1 tablet by mouth daily.         Marland Kitchen sulfamethoxazole-trimethoprim (SEPTRA DS) 800-160 MG per tablet   Oral   Take 1 tablet by mouth 2 (two) times daily.   14 tablet   0    BP 106/73  Pulse 67  Temp(Src) 98.8 F (37.1 C) (Oral)  Resp 18  Ht 5\' 2"  (1.575 m)  Wt 135 lb (61.236 kg)  BMI 24.69 kg/m2  SpO2 99%  LMP 07/06/2013 Physical Exam  Nursing note and vitals reviewed. Constitutional: She is oriented to person, place, and time. She appears well-developed and well-nourished. No distress.  HENT:  Head: Atraumatic.  Eyes: EOM are normal.  Neck: Neck supple.  Pulmonary/Chest: Effort normal.  Abdominal: Soft. There is tenderness in the suprapubic area. There is no rebound, no guarding and no CVA tenderness.  Genitourinary:  External genitalia without lesions. Frothy malodorous discharge vaginal vault. No CMT, no adnexal tenderness. Uterus without palpable enlargement.   Musculoskeletal: Normal range of motion.  Neurological: She is alert and oriented to person, place, and time. No cranial nerve deficit.  Skin: Skin is warm and dry.  Psychiatric: She has a normal mood and affect. Her behavior is normal.    ED Course  Procedures  Results for orders placed during the hospital encounter of 07/20/13 (from the past 24 hour(s))  URINALYSIS,  ROUTINE W REFLEX MICROSCOPIC     Status: Abnormal   Collection Time    07/20/13  1:41 PM      Result Value Range   Color, Urine YELLOW  YELLOW   APPearance CLOUDY (*) CLEAR   Specific Gravity, Urine 1.028  1.005 - 1.030   pH 8.0  5.0 - 8.0   Glucose, UA NEGATIVE  NEGATIVE mg/dL   Hgb urine dipstick SMALL (*) NEGATIVE   Bilirubin Urine NEGATIVE  NEGATIVE   Ketones, ur NEGATIVE  NEGATIVE mg/dL   Protein, ur 956100 (*) NEGATIVE mg/dL   Urobilinogen, UA 1.0  0.0 - 1.0 mg/dL   Nitrite NEGATIVE  NEGATIVE   Leukocytes, UA MODERATE (*) NEGATIVE  PREGNANCY,  URINE     Status: None   Collection Time    07/20/13  1:41 PM      Result Value Range   Preg Test, Ur NEGATIVE  NEGATIVE  URINE MICROSCOPIC-ADD ON     Status: Abnormal   Collection Time    07/20/13  1:41 PM      Result Value Range   Squamous Epithelial / LPF RARE  RARE   WBC, UA TOO NUMEROUS TO COUNT  <3 WBC/hpf   RBC / HPF 3-6  <3 RBC/hpf   Bacteria, UA MANY (*) RARE  WET PREP, GENITAL     Status: Abnormal   Collection Time    07/20/13  3:34 PM      Result Value Range   Yeast Wet Prep HPF POC NONE SEEN  NONE SEEN   Trich, Wet Prep NONE SEEN  NONE SEEN   Clue Cells Wet Prep HPF POC MODERATE (*) NONE SEEN   WBC, Wet Prep HPF POC FEW (*) NONE SEEN    MDM  28 y.o. female with UTI symptoms x 2 days and vaginal discharge. Will treat for UTI and send urine for culture. Will treat BV and send cultures for GC and Chlamydia. Discussed with the patient clinical and lab findings and plan of care. All questioned fully answered. She will return if any problems arise.    Medication List    TAKE these medications       metroNIDAZOLE 500 MG tablet  Commonly known as:  FLAGYL  Take 1 tablet (500 mg total) by mouth 2 (two) times daily.     phenazopyridine 200 MG tablet  Commonly known as:  PYRIDIUM  Take 1 tablet (200 mg total) by mouth 3 (three) times daily.      ASK your doctor about these medications       ibuprofen 200 MG tablet  Commonly known as:  ADVIL,MOTRIN  Take 400 mg by mouth every 6 (six) hours as needed. For pain     predniSONE 10 MG tablet  Commonly known as:  DELTASONE  Take 2 tablets (20 mg total) by mouth 2 (two) times daily.     prenatal multivitamin Tabs tablet  Take 1 tablet by mouth daily.     sulfamethoxazole-trimethoprim 800-160 MG per tablet  Commonly known as:  SEPTRA DS  Take 1 tablet by mouth 2 (two) times daily.  Ask about: Which instructions should I use?     sulfamethoxazole-trimethoprim 800-160 MG per tablet  Commonly known as:  BACTRIM DS,SEPTRA  DS  Take 1 tablet by mouth 2 (two) times daily.  Ask about: Which instructions should I use?          [address]rick[address] StreetHope East Port OrchardM Neese, [place]NP 07/21/13 [phone]

## 2013-07-20 NOTE — ED Notes (Signed)
Scanty urine output and urinary frequency x 2 days.

## 2013-07-21 LAB — GC/CHLAMYDIA PROBE AMP
CT Probe RNA: NEGATIVE
GC Probe RNA: NEGATIVE

## 2013-07-21 NOTE — ED Provider Notes (Signed)
Medical screening examination/treatment/procedure(s) were performed by non-physician practitioner and as supervising physician I was immediately available for consultation/collaboration.  EKG Interpretation   None         Junius ArgyleForrest S Jerimah Witucki, MD 07/21/13 1726

## 2013-07-22 LAB — URINE CULTURE

## 2013-07-23 ENCOUNTER — Telehealth (HOSPITAL_COMMUNITY): Payer: Self-pay | Admitting: Emergency Medicine

## 2013-07-23 NOTE — ED Notes (Signed)
Post ED Visit - Positive Culture Follow-up  Culture report reviewed by antimicrobial stewardship pharmacist: []  Marlou SaWes Dulaney, Pharm.D., BCPS []  Celedonio MiyamotoJeremy Frens, Pharm.D., BCPS []  Georgina PillionElizabeth Martin, Pharm.D., BCPS []  HatfieldMinh Pham, [place]., BCPS, AAHIVP [x]  Estella HuskMichelle Turner, Pharm.D., BCPS, AAHIVP  Positive urine culture Treated with Sulfa-Trimeth, organism sensitive to the same and no further patient follow-up is required at this time.  Zeb ComfortHolland, Aliceson Dolbow 07/23/2013, 5:31 PM

## 2013-10-12 ENCOUNTER — Emergency Department (HOSPITAL_BASED_OUTPATIENT_CLINIC_OR_DEPARTMENT_OTHER)
Admission: EM | Admit: 2013-10-12 | Discharge: 2013-10-12 | Disposition: A | Discharge status: Home / Self Care | Payer: Medicaid Other | Attending: Emergency Medicine | Admitting: Emergency Medicine

## 2013-10-12 ENCOUNTER — Encounter (HOSPITAL_BASED_OUTPATIENT_CLINIC_OR_DEPARTMENT_OTHER): Payer: Self-pay | Admitting: Emergency Medicine

## 2013-10-12 DIAGNOSIS — R252 Cramp and spasm: Secondary | ICD-10-CM

## 2013-10-12 DIAGNOSIS — Z792 Long term (current) use of antibiotics: Secondary | ICD-10-CM | POA: Insufficient documentation

## 2013-10-12 DIAGNOSIS — M25569 Pain in unspecified knee: Secondary | ICD-10-CM | POA: Insufficient documentation

## 2013-10-12 DIAGNOSIS — IMO0002 Reserved for concepts with insufficient information to code with codable children: Secondary | ICD-10-CM | POA: Insufficient documentation

## 2013-10-12 DIAGNOSIS — Z79899 Other long term (current) drug therapy: Secondary | ICD-10-CM | POA: Insufficient documentation

## 2013-10-12 NOTE — ED Provider Notes (Signed)
Medical screening examination/treatment/procedure(s) were performed by non-physician practitioner and as supervising physician I was immediately available for consultation/collaboration.   EKG Interpretation None        Blaise Palladino B. Bernette MayersSheldon, MD 10/12/13 2041

## 2013-10-12 NOTE — ED Provider Notes (Signed)
CSN: [id number]     Arrival date & time 10/12/13  1827 History   First MD Initiated Contact with Patient 10/12/13 1852     Chief Complaint  Patient presents with  . Leg Pain     (Consider location/radiation/quality/duration/timing/severity/associated sxs/prior Treatment) Patient is a 28 y.o. female presenting with leg pain. The history is provided by the patient. No language interpreter was used.  Leg Pain Location:  Knee Knee location:  L knee Pain details:    Quality:  Aching and cramping   Radiates to:  Does not radiate   Severity:  Mild   Onset quality:  Gradual   Duration:  1 day   Timing:  Constant Chronicity:  New Dislocation: no   Worsened by:  Nothing tried Ineffective treatments:  None tried Associated symptoms: no back pain and no numbness   Pt reports having to cramps in her leg today.   One in her foot and one in her calf that lasted for about one monute  History reviewed. No pertinent past medical history. Past Surgical History  Procedure Laterality Date  . Tonsillectomy     No family history on file. History  Substance Use Topics  . Smoking status: Never Smoker   . Smokeless tobacco: Not on file  . Alcohol Use: No   OB History   Grav Para Term Preterm Abortions TAB SAB Ect Mult Living                 Review of Systems  Musculoskeletal: Negative for back pain.  All other systems reviewed and are negative.     Allergies  Review of patient's allergies indicates no known allergies.  Home Medications   Prior to Admission medications   Medication Sig Start Date End Date Taking? Authorizing Provider  ibuprofen (ADVIL,MOTRIN) 200 MG tablet Take 400 mg by mouth every 6 (six) hours as needed. For pain    Historical Provider, MD  metroNIDAZOLE (FLAGYL) 500 MG tablet Take 1 tablet (500 mg total) by mouth 2 (two) times daily. 07/20/13   Hope Orlene OchM Neese, NP  phenazopyridine (PYRIDIUM) 200 MG tablet Take 1 tablet (200 mg total) by mouth 3 (three) times daily.  07/20/13   Hope Orlene OchM Neese, NP  predniSONE (DELTASONE) 10 MG tablet Take 2 tablets (20 mg total) by mouth 2 (two) times daily. 12/25/12   Hope Orlene OchM Neese, NP  Prenatal Vit-Fe Fumarate-FA (PRENATAL MULTIVITAMIN) TABS Take 1 tablet by mouth daily.    Historical Provider, MD  sulfamethoxazole-trimethoprim (SEPTRA DS) 800-160 MG per tablet Take 1 tablet by mouth 2 (two) times daily. 12/25/12   Hope Orlene OchM Neese, NP   BP 138/75  Pulse 60  Temp(Src) 98.3 F (36.8 C) (Oral)  Resp 18  Ht 5\' 2"  (1.575 m)  Wt 140 lb (63.504 kg)  BMI 25.60 kg/m2  SpO2 100%  LMP 09/03/2013 Physical Exam  Nursing note and vitals reviewed. Constitutional: She is oriented to person, place, and time. She appears well-developed and well-nourished.  Musculoskeletal: She exhibits tenderness.  Calf nopntender, no swelling,  Foot no swelling,     Neurological: She is alert and oriented to person, place, and time. She has normal reflexes.  Skin: Skin is warm.  Psychiatric: She has a normal mood and affect.    ED Course  Procedures (including critical care time) Labs Review Labs Reviewed - No data to display  Imaging Review No results found.   EKG Interpretation None      MDM   Final diagnoses:  Muscle cramps    No hormones,  No recent travel.   I doubt dvt,   I think pt had muscle cramp.   Pt counseled on drinking fluidw.    Lonia SkinnerLeslie K WeirSofia, PA-C 10/12/13 2024

## 2013-10-12 NOTE — Discharge Instructions (Signed)
Leg Cramps  Leg cramps that occur during exercise can be caused by poor circulation or dehydration. However, muscle cramps that occur at rest or during the night are usually not due to any serious medical problem. Heat cramps may cause muscle spasms during hot weather.   CAUSES  There is no clear cause for muscle cramps. However, dehydration may be a factor for those who do not drink enough fluids and those who exercise in the heat. Imbalances in the level of sodium, potassium, calcium or magnesium in the muscle tissue may also be a factor. Some medications, such as water pills (diuretics), may cause loss of chemicals that the body needs (like sodium and potassium) and cause muscle cramps.  TREATMENT   · Make sure your diet has enough fluids and essential minerals for the muscle to work normally.  · Avoid strenuous exercise for several days if you have been having frequent leg cramps.  · Stretch and massage the cramped muscle for several minutes.  · Some medicines may be helpful in some patients with night cramps. Only take over-the-counter or prescription medicines as directed by your caregiver.  SEEK IMMEDIATE MEDICAL CARE IF:   · Your leg cramps become worse.  · Your foot becomes cold, numb, or blue.  Document Released: 07/16/2004 Document Revised: 08/31/2011 Document Reviewed: 07/03/2008  ExitCare® Patient Information ©2014 [employer].

## 2013-10-12 NOTE — ED Notes (Signed)
Pain in her left calf this am and this afternoon. Pain comes 2-3 minutes and goes away. No injury. No swelling.

## 2014-05-02 ENCOUNTER — Emergency Department (HOSPITAL_BASED_OUTPATIENT_CLINIC_OR_DEPARTMENT_OTHER)
Admission: EM | Admit: 2014-05-02 | Discharge: 2014-05-02 | Disposition: A | Discharge status: Home / Self Care | Payer: Medicaid Other | Attending: Emergency Medicine | Admitting: Emergency Medicine

## 2014-05-02 ENCOUNTER — Encounter (HOSPITAL_BASED_OUTPATIENT_CLINIC_OR_DEPARTMENT_OTHER): Payer: Self-pay

## 2014-05-02 DIAGNOSIS — O2391 Unspecified genitourinary tract infection in pregnancy, first trimester: Secondary | ICD-10-CM | POA: Insufficient documentation

## 2014-05-02 DIAGNOSIS — Z3490 Encounter for supervision of normal pregnancy, unspecified, unspecified trimester: Secondary | ICD-10-CM

## 2014-05-02 DIAGNOSIS — B9689 Other specified bacterial agents as the cause of diseases classified elsewhere: Secondary | ICD-10-CM

## 2014-05-02 DIAGNOSIS — N760 Acute vaginitis: Secondary | ICD-10-CM

## 2014-05-02 DIAGNOSIS — Z3A00 Weeks of gestation of pregnancy not specified: Secondary | ICD-10-CM | POA: Insufficient documentation

## 2014-05-02 LAB — CBC WITH DIFFERENTIAL/PLATELET
Basophils Absolute: 0 10*3/uL (ref 0.0–0.1)
Basophils Relative: 0 % (ref 0–1)
EOS PCT: 2 % (ref 0–5)
Eosinophils Absolute: 0.2 10*3/uL (ref 0.0–0.7)
HCT: 34.6 % — ABNORMAL LOW (ref 36.0–46.0)
Hemoglobin: 12 g/dL (ref 12.0–15.0)
LYMPHS ABS: 3 10*3/uL (ref 0.7–4.0)
LYMPHS PCT: 41 % (ref 12–46)
MCH: 29.5 pg (ref 26.0–34.0)
MCHC: 34.7 g/dL (ref 30.0–36.0)
MCV: 85 fL (ref 78.0–100.0)
Monocytes Absolute: 0.6 10*3/uL (ref 0.1–1.0)
Monocytes Relative: 9 % (ref 3–12)
Neutro Abs: 3.5 10*3/uL (ref 1.7–7.7)
Neutrophils Relative %: 48 % (ref 43–77)
Platelets: 288 10*3/uL (ref 150–400)
RBC: 4.07 MIL/uL (ref 3.87–5.11)
RDW: 12.4 % (ref 11.5–15.5)
WBC: 7.3 10*3/uL (ref 4.0–10.5)

## 2014-05-02 LAB — URINALYSIS, ROUTINE W REFLEX MICROSCOPIC
Bilirubin Urine: NEGATIVE
Glucose, UA: NEGATIVE mg/dL
Hgb urine dipstick: NEGATIVE
Ketones, ur: 15 mg/dL — AB
LEUKOCYTES UA: NEGATIVE
NITRITE: NEGATIVE
PH: 5.5 (ref 5.0–8.0)
Protein, ur: NEGATIVE mg/dL
SPECIFIC GRAVITY, URINE: 1.035 — AB (ref 1.005–1.030)
Urobilinogen, UA: 0.2 mg/dL (ref 0.0–1.0)

## 2014-05-02 LAB — COMPREHENSIVE METABOLIC PANEL
ALT: 6 U/L (ref 0–35)
AST: 13 U/L (ref 0–37)
Albumin: 3.7 g/dL (ref 3.5–5.2)
Alkaline Phosphatase: 55 U/L (ref 39–117)
Anion gap: 13 (ref 5–15)
BUN: 12 mg/dL (ref 6–23)
CALCIUM: 9.1 mg/dL (ref 8.4–10.5)
CO2: 22 meq/L (ref 19–32)
CREATININE: 0.5 mg/dL (ref 0.50–1.10)
Chloride: 101 mEq/L (ref 96–112)
GLUCOSE: 90 mg/dL (ref 70–99)
Potassium: 4 mEq/L (ref 3.7–5.3)
Sodium: 136 mEq/L — ABNORMAL LOW (ref 137–147)
Total Bilirubin: <0.2 mg/dL — ABNORMAL LOW (ref 0.3–1.2)
Total Protein: 6.9 g/dL (ref 6.0–8.3)

## 2014-05-02 LAB — PREGNANCY, URINE: Preg Test, Ur: POSITIVE — AB

## 2014-05-02 LAB — WET PREP, GENITAL
TRICH WET PREP: NONE SEEN
YEAST WET PREP: NONE SEEN

## 2014-05-02 LAB — HCG, QUANTITATIVE, PREGNANCY: HCG, BETA CHAIN, QUANT, S: 2374 m[IU]/mL — AB (ref <5)

## 2014-05-02 LAB — LIPASE, BLOOD: LIPASE: 19 U/L (ref 11–59)

## 2014-05-02 MED ORDER — CONCEPT OB 130-92.4-1 MG PO CAPS: 1.0000 | ORAL_CAPSULE | Freq: Every day | ORAL | Status: DC

## 2014-05-02 MED ORDER — METRONIDAZOLE 500 MG PO TABS: 500.0000 mg | ORAL_TABLET | Freq: Two times a day (BID) | ORAL | Status: DC

## 2014-05-02 NOTE — ED Notes (Signed)
Abd pain x 4 day-denies n/v/d-urinary s/s and vaginal d/c

## 2014-05-02 NOTE — ED Provider Notes (Signed)
CSN: [id number]     Arrival date & time 05/02/14  1419 History   First MD Initiated Contact with Patient 05/02/14 1457     Chief Complaint  Patient presents with  . Abdominal Pain     (Consider location/radiation/quality/duration/timing/severity/associated sxs/prior Treatment) HPI  This is a 28 year old female presents to emergency department with chief complaint of abdominal cramping for the past 4 days. Patient denies any nausea, vomiting, diarrhea, urinary or vaginal symptoms. She states the pain is mild to moderate, intermittent, crampy "like menstrual cramps." She denies fevers, chills, myalgias or other signs or symptoms of systemic infection. She has a positive urine pregnancy test on triage exam.  History reviewed. No pertinent past medical history. Past Surgical History  Procedure Laterality Date  . Tonsillectomy     No family history on file. History  Substance Use Topics  . Smoking status: Never Smoker   . Smokeless tobacco: Not on file  . Alcohol Use: No   OB History    No data available     Review of Systems 10 systems are reviewed and are negative unless stated in the history of present illness.   Allergies  Review of patient's allergies indicates no known allergies.  Home Medications   Prior to Admission medications   Not on File   BP 117/76 mmHg  Pulse 86  Temp(Src) 98 F (36.7 C) (Oral)  Resp 20  SpO2 99%  LMP 03/30/2014 Physical Exam  Constitutional: She is oriented to person, place, and time. She appears well-developed and well-nourished. No distress.  HENT:  Head: Normocephalic and atraumatic.  Eyes: Conjunctivae are normal. No scleral icterus.  Neck: Normal range of motion.  Cardiovascular: Normal rate, regular rhythm and normal heart sounds.  Exam reveals no gallop and no friction rub.   No murmur heard. Pulmonary/Chest: Effort normal and breath sounds normal. No respiratory distress.  Abdominal: Soft. Bowel sounds are normal. She  exhibits no distension and no mass. There is no tenderness. There is no guarding.  Genitourinary:  Pelvic exam: normal external genitalia, vulva, vagina, cervix, uterus and adnexa. Thick, White vaginal discharge. No foul odor.   Neurological: She is alert and oriented to person, place, and time.  Skin: Skin is warm and dry. She is not diaphoretic.  Vitals reviewed.   ED Course  Procedures (including critical care time) Labs Review Labs Reviewed  WET PREP, GENITAL - Abnormal; Notable for the following:    Clue Cells Wet Prep HPF POC MODERATE (*)    WBC, Wet Prep HPF POC FEW (*)    All other components within normal limits  URINALYSIS, ROUTINE W REFLEX MICROSCOPIC - Abnormal; Notable for the following:    APPearance CLOUDY (*)    Specific Gravity, Urine 1.035 (*)    Ketones, ur 15 (*)    All other components within normal limits  PREGNANCY, URINE - Abnormal; Notable for the following:    Preg Test, Ur POSITIVE (*)    All other components within normal limits  CBC WITH DIFFERENTIAL - Abnormal; Notable for the following:    HCT 34.6 (*)    All other components within normal limits  COMPREHENSIVE METABOLIC PANEL - Abnormal; Notable for the following:    Sodium 136 (*)    Total Bilirubin <0.2 (*)    All other components within normal limits  HCG, QUANTITATIVE, PREGNANCY - Abnormal; Notable for the following:    hCG, Beta Chain, Quant, S 2374 (*)    All other components within normal limits  GC/CHLAMYDIA PROBE AMP  LIPASE, BLOOD    Imaging Review No results found.   EKG Interpretation None      MDM   Final diagnoses:  Pregnant  BV (bacterial vaginosis)     Patient is approximately 4 weeks' pregnant and her beta hCG is consistent with her estimated gestational age. She has no vaginal bleeding or concern for possible ectopic pregnancy. No tenderness to palpation on abdominal or vaginal exam. This will be treated with oral Flagyl for 7 days. Follow-up with OB/GYN. And  prenatal vitamins at discharge.    Arthor Captainbigail Conlin Brahm, PA-C 05/02/14 1726  Gerhard Munchobert Lockwood, MD 05/02/14 [phone]

## 2014-05-02 NOTE — Discharge Instructions (Signed)
Abdominal Pain During Pregnancy °Abdominal pain is common in pregnancy. Most of the time, it does not cause harm. There are many causes of abdominal pain. Some causes are more serious than others. Some of the causes of abdominal pain in pregnancy are easily diagnosed. Occasionally, the diagnosis takes time to understand. Other times, the cause is not determined. Abdominal pain can be a sign that something is very wrong with the pregnancy, or the pain may have nothing to do with the pregnancy at all. For this reason, always tell your health care provider if you have any abdominal discomfort. °HOME CARE INSTRUCTIONS  °Monitor your abdominal pain for any changes. The following actions may help to alleviate any discomfort you are experiencing: °· Do not have sexual intercourse or put anything in your vagina until your symptoms go away completely. °· Get plenty of rest until your pain improves. °· Drink clear fluids if you feel nauseous. Avoid solid food as long as you are uncomfortable or nauseous. °· Only take over-the-counter or prescription medicine as directed by your health care provider. °· Keep all follow-up appointments with your health care provider. °SEEK IMMEDIATE MEDICAL CARE IF: °· You are bleeding, leaking fluid, or passing tissue from the vagina. °· You have increasing pain or cramping. °· You have persistent vomiting. °· You have painful or bloody urination. °· You have a fever. °· You notice a decrease in your baby's movements. °· You have extreme weakness or feel faint. °· You have shortness of breath, with or without abdominal pain. °· You develop a severe headache with abdominal pain. °· You have abnormal vaginal discharge with abdominal pain. °· You have persistent diarrhea. °· You have abdominal pain that continues even after rest, or gets worse. °MAKE SURE YOU:  °· Understand these instructions. °· Will watch your condition. °· Will get help right away if you are not doing well or get  worse. °Document Released: 06/08/2005 Document Revised: 03/29/2013 Document Reviewed: 01/05/2013 °ExitCare® Patient Information ©2015 [employer]. This information is not intended to replace advice given to you by your health care provider. Make sure you discuss any questions you have with your health care provider. ° °Bacterial Vaginosis °Bacterial vaginosis is an infection of the vagina. It happens when too many of certain germs (bacteria) grow in the vagina. °HOME CARE °· Take your medicine as told by your doctor. °· Finish your medicine even if you start to feel better. °· Do not have sex until you finish your medicine and are better. °· Tell your sex partner that you have an infection. They should see their doctor for treatment. °· Practice safe sex. Use condoms. Have only one sex partner. °GET HELP IF: °· You are not getting better after 3 days of treatment. °· You have more grey fluid (discharge) coming from your vagina than before. °· You have more pain than before. °· You have a fever. °MAKE SURE YOU:  °· Understand these instructions. °· Will watch your condition. °· Will get help right away if you are not doing well or get worse. °Document Released: 03/17/2008 Document Revised: 03/29/2013 Document Reviewed: 01/18/2013 °ExitCare® Patient Information ©2015 [employer]. This information is not intended to replace advice given to you by your health care provider. Make sure you discuss any questions you have with your health care provider. ° °

## 2014-05-03 LAB — GC/CHLAMYDIA PROBE AMP
CT Probe RNA: NEGATIVE
GC PROBE AMP APTIMA: NEGATIVE

## 2014-09-10 ENCOUNTER — Encounter (HOSPITAL_BASED_OUTPATIENT_CLINIC_OR_DEPARTMENT_OTHER): Payer: Self-pay | Admitting: *Deleted

## 2014-09-10 ENCOUNTER — Emergency Department (HOSPITAL_BASED_OUTPATIENT_CLINIC_OR_DEPARTMENT_OTHER): Payer: Medicaid Other

## 2014-09-10 ENCOUNTER — Emergency Department (HOSPITAL_BASED_OUTPATIENT_CLINIC_OR_DEPARTMENT_OTHER)
Admission: EM | Admit: 2014-09-10 | Discharge: 2014-09-10 | Disposition: A | Discharge status: Home / Self Care | Payer: Medicaid Other | Attending: Emergency Medicine | Admitting: Emergency Medicine

## 2014-09-10 DIAGNOSIS — M436 Torticollis: Secondary | ICD-10-CM | POA: Insufficient documentation

## 2014-09-10 DIAGNOSIS — Z79899 Other long term (current) drug therapy: Secondary | ICD-10-CM | POA: Insufficient documentation

## 2014-09-10 DIAGNOSIS — Z792 Long term (current) use of antibiotics: Secondary | ICD-10-CM | POA: Insufficient documentation

## 2014-09-10 MED ORDER — HYDROCODONE-ACETAMINOPHEN 5-325 MG PO TABS
1.0000 | ORAL_TABLET | Freq: Once | ORAL | Status: AC
  Administered 2014-09-10: 1 via ORAL
  Filled 2014-09-10: qty 1

## 2014-09-10 MED ORDER — CYCLOBENZAPRINE HCL 10 MG PO TABS
5.0000 mg | ORAL_TABLET | Freq: Once | ORAL | Status: AC
  Administered 2014-09-10: 5 mg via ORAL
  Filled 2014-09-10: qty 1

## 2014-09-10 MED ORDER — CYCLOBENZAPRINE HCL 5 MG PO TABS: 5.0000 mg | ORAL_TABLET | Freq: Two times a day (BID) | ORAL | Status: DC | PRN

## 2014-09-10 MED ORDER — HYDROCODONE-ACETAMINOPHEN 5-325 MG PO TABS: 1.0000 | ORAL_TABLET | Freq: Four times a day (QID) | ORAL | Status: DC | PRN

## 2014-09-10 NOTE — Discharge Instructions (Signed)

## 2014-09-10 NOTE — ED Notes (Signed)
Pt c/o of left side neck pain that was present when she woke up. Pt tried ice pack and heat but neither worked. Pt did not try tylenol or motrin.

## 2014-09-10 NOTE — ED Provider Notes (Signed)
CSN: [id number]     Arrival date & time 09/10/14  [id number] History   First MD Initiated Contact with Patient 09/10/14 [phone]     Chief Complaint  Patient presents with  . Neck Pain     (Consider location/radiation/quality/duration/timing/severity/associated sxs/prior Treatment) HPI Comments: Pt states that she woke up like this this morning  Patient is a 29 y.o. female presenting with neck pain. The history is provided by the patient. No language interpreter was used.  Neck Pain Pain location:  L side Quality:  Aching Pain radiates to:  Does not radiate Pain severity:  Moderate Onset quality:  Sudden Timing:  Constant Progression:  Unchanged Chronicity:  New Relieved by:  Nothing Worsened by:  Twisting Ineffective treatments:  None tried Associated symptoms: no fever, no numbness, no tingling and no weakness     History reviewed. No pertinent past medical history. Past Surgical History  Procedure Laterality Date  . Tonsillectomy     No family history on file. History  Substance Use Topics  . Smoking status: Never Smoker   . Smokeless tobacco: Not on file  . Alcohol Use: No   OB History    No data available     Review of Systems  Constitutional: Negative for fever.  Musculoskeletal: Positive for neck pain.  Neurological: Negative for tingling, weakness and numbness.  All other systems reviewed and are negative.     Allergies  Review of patient's allergies indicates no known allergies.  Home Medications   Prior to Admission medications   Medication Sig Start Date End Date Taking? Authorizing Provider  metroNIDAZOLE (FLAGYL) 500 MG tablet Take 1 tablet (500 mg total) by mouth 2 (two) times daily. One po bid x 7 days 05/02/14   Arthor CaptainAbigail Harris, PA-C  Prenat w/o A Vit-FeFum-FePo-FA (CONCEPT OB) 130-92.4-1 MG CAPS Take 1 capsule by mouth daily. 05/02/14   Abigail Harris, PA-C   BP 148/80 mmHg  Pulse 62  Temp(Src) 98.5 F (36.9 C) (Oral)  Resp 16  Ht 5' (1.524 m)   Wt 165 lb (74.844 kg)  BMI 32.22 kg/m2  SpO2 99%  LMP 08/27/2014 Physical Exam  Constitutional: She is oriented to person, place, and time. She appears well-developed and well-nourished.  Cardiovascular: Normal rate and regular rhythm.  Exam reveals friction rub.   Pulmonary/Chest: Effort normal and breath sounds normal.  Musculoskeletal: Normal range of motion.  Left cervical paraspinal tenderness. Full rom of all extremities. Grip strength equal  Neurological: She is alert and oriented to person, place, and time.  Skin: Skin is warm and dry.  Nursing note and vitals reviewed.   ED Course  Procedures (including critical care time) Labs Review Labs Reviewed - No data to display  Imaging Review Dg Cervical Spine Complete  09/10/2014   CLINICAL DATA:  Left-sided neck pain and stiffness.  EXAM: CERVICAL SPINE  4+ VIEWS  COMPARISON:  None.  FINDINGS: C1 to the inferior endplate of T1 is imaged on the provided lateral radiograph.  There is straightening and reversal of the expected cervical lordosis with mild kyphosis centered about the C4-C5 intervertebral disc space. No anterolisthesis or retrolisthesis.  The bilateral facets appear normally aligned. The dens is normally positioned between the lateral masses of C1.  Cervical vertebral body heights are preserved. Prevertebral soft tissues are normal.  Intervertebral disc spaces are preserved. The bilateral neural foramina appear patent given obliquity.  Regional soft tissues appear normal.  Limited visualization of the lung apices is normal.  IMPRESSION: Straightening and slight  reversal of the expected cervical lordosis, nonspecific though could be seen in the setting of muscle spasm. Otherwise, no acute findings.   Electronically Signed   By: Simonne Come M.D.   On: 09/10/2014 10:32     EKG Interpretation None      MDM   Final diagnoses:  Torticollis    X-ray consistent with spasm. Pt treated with hydrocodone and flexeril. Pt is  neurologically intact    Teressa Lower, NP 09/10/14 1118  Blane Ohara, MD 09/12/14 0025

## 2016-11-25 ENCOUNTER — Emergency Department (HOSPITAL_BASED_OUTPATIENT_CLINIC_OR_DEPARTMENT_OTHER)
Admission: EM | Admit: 2016-11-25 | Discharge: 2016-11-25 | Discharge status: Against Medical Advice | Payer: Medicaid Other | Attending: Emergency Medicine | Admitting: Emergency Medicine

## 2016-11-25 ENCOUNTER — Encounter (HOSPITAL_BASED_OUTPATIENT_CLINIC_OR_DEPARTMENT_OTHER): Payer: Self-pay

## 2016-11-25 DIAGNOSIS — Z3A09 9 weeks gestation of pregnancy: Secondary | ICD-10-CM | POA: Insufficient documentation

## 2016-11-25 DIAGNOSIS — O26891 Other specified pregnancy related conditions, first trimester: Secondary | ICD-10-CM | POA: Insufficient documentation

## 2016-11-25 DIAGNOSIS — R109 Unspecified abdominal pain: Secondary | ICD-10-CM

## 2016-11-25 LAB — COMPREHENSIVE METABOLIC PANEL
ALT: 11 U/L — AB (ref 14–54)
AST: 15 U/L (ref 15–41)
Albumin: 3.5 g/dL (ref 3.5–5.0)
Alkaline Phosphatase: 57 U/L (ref 38–126)
Anion gap: 6 (ref 5–15)
BUN: 11 mg/dL (ref 6–20)
CO2: 25 mmol/L (ref 22–32)
CREATININE: 0.5 mg/dL (ref 0.44–1.00)
Calcium: 8.8 mg/dL — ABNORMAL LOW (ref 8.9–10.3)
Chloride: 104 mmol/L (ref 101–111)
Glucose, Bld: 90 mg/dL (ref 65–99)
Potassium: 3.8 mmol/L (ref 3.5–5.1)
SODIUM: 135 mmol/L (ref 135–145)
Total Bilirubin: 0.5 mg/dL (ref 0.3–1.2)
Total Protein: 6.8 g/dL (ref 6.5–8.1)

## 2016-11-25 LAB — CBC WITH DIFFERENTIAL/PLATELET
BASOS ABS: 0 10*3/uL (ref 0.0–0.1)
Basophils Relative: 0 %
Eosinophils Absolute: 0.2 10*3/uL (ref 0.0–0.7)
Eosinophils Relative: 3 %
HEMATOCRIT: 34.7 % — AB (ref 36.0–46.0)
HEMOGLOBIN: 12 g/dL (ref 12.0–15.0)
Lymphocytes Relative: 37 %
Lymphs Abs: 2.1 10*3/uL (ref 0.7–4.0)
MCH: 29.7 pg (ref 26.0–34.0)
MCHC: 34.6 g/dL (ref 30.0–36.0)
MCV: 85.9 fL (ref 78.0–100.0)
Monocytes Absolute: 0.4 10*3/uL (ref 0.1–1.0)
Monocytes Relative: 7 %
NEUTROS ABS: 3.1 10*3/uL (ref 1.7–7.7)
NEUTROS PCT: 53 %
Platelets: 272 10*3/uL (ref 150–400)
RBC: 4.04 MIL/uL (ref 3.87–5.11)
RDW: 12.8 % (ref 11.5–15.5)
WBC: 5.8 10*3/uL (ref 4.0–10.5)

## 2016-11-25 LAB — URINALYSIS, ROUTINE W REFLEX MICROSCOPIC
BILIRUBIN URINE: NEGATIVE
Glucose, UA: NEGATIVE mg/dL
Ketones, ur: NEGATIVE mg/dL
NITRITE: NEGATIVE
Protein, ur: 30 mg/dL — AB
SPECIFIC GRAVITY, URINE: 1.021 (ref 1.005–1.030)
pH: 7 (ref 5.0–8.0)

## 2016-11-25 LAB — URINALYSIS, MICROSCOPIC (REFLEX)

## 2016-11-25 LAB — HCG, QUANTITATIVE, PREGNANCY: HCG, BETA CHAIN, QUANT, S: 17468 m[IU]/mL — AB (ref <5)

## 2016-11-25 LAB — PREGNANCY, URINE: Preg Test, Ur: POSITIVE — AB

## 2016-11-25 NOTE — ED Notes (Signed)
RN Neysa BonitoChristy informed that Pt wants IV out and wants to leave.

## 2016-11-25 NOTE — Discharge Instructions (Signed)
Return immediately or go to another emergency department. You need to have an ultrasound of your pelvic organs to make sure that there is no pregnancy your tubes.

## 2016-11-25 NOTE — ED Provider Notes (Signed)
[facility] Provider Note   CSN: [id number] Arrival date & time: 11/25/16  1109     History   Chief Complaint Chief Complaint  Patient presents with  . Abdominal Pain    HPI Tracey Elliott is a 31 y.o. female.Complains of right-sided mid abdominal pain for the past 2 months occurs approximately twice per week. It is improved with eating. She is presently asymptomatic. Last bowel movement today, normal. No fever. No nausea or vomiting. No appetite change. No other associated symptoms. Patient had a miscarriage 4 days ago, was seen at a hospital in [place].  HPI  History reviewed. No pertinent past medical history.  There are no active problems to display for this patient.   Past Surgical History:  Procedure Laterality Date  . TONSILLECTOMY      OB History    No data available       Home Medications    Prior to Admission medications   Not on File    Family History No family history on file.  Social History Social History  Substance Use Topics  . Smoking status: Never Smoker  . Smokeless tobacco: Never Used  . Alcohol use No     Allergies   Patient has no known allergies.   Review of Systems Review of Systems  Constitutional: Negative.   HENT: Negative.   Respiratory: Negative.   Cardiovascular: Negative.   Gastrointestinal: Positive for abdominal pain.  Musculoskeletal: Negative.   Skin: Negative.   Neurological: Negative.   Psychiatric/Behavioral: Negative.   All other systems reviewed and are negative.    Physical Exam Updated Vital Signs BP 129/83 (BP Location: Left Arm)   Pulse 75   Temp 99.2 F (37.3 C) (Oral)   Resp 20   Ht 5\' 3"  (1.6 m)   Wt 73.9 kg (163 lb)   LMP 09/17/2016 Comment: pt states she was preg-had miscarriage this month  SpO2 100%   BMI 28.87 kg/m   Physical Exam  Constitutional: She appears well-developed and well-nourished.  HENT:  Head: Normocephalic and atraumatic.  Eyes:  Conjunctivae are normal. Pupils are equal, round, and reactive to light.  Neck: Neck supple. No tracheal deviation present. No thyromegaly present.  Cardiovascular: Normal rate and regular rhythm.   No murmur heard. Pulmonary/Chest: Effort normal and breath sounds normal.  Abdominal: Soft. Bowel sounds are normal. She exhibits no distension. There is no tenderness.  Musculoskeletal: Normal range of motion. She exhibits no edema or tenderness.  Neurological: She is alert. Coordination normal.  Skin: Skin is warm and dry. No rash noted.  Psychiatric: She has a normal mood and affect.  Nursing note and vitals reviewed.    ED Treatments / Results  Labs (all labs ordered are listed, but only abnormal results are displayed) Labs Reviewed  URINALYSIS, ROUTINE W REFLEX MICROSCOPIC - Abnormal; Notable for the following:       Result Value   APPearance CLOUDY (*)    Hgb urine dipstick LARGE (*)    Protein, ur 30 (*)    Leukocytes, UA TRACE (*)    All other components within normal limits  PREGNANCY, URINE - Abnormal; Notable for the following:    Preg Test, Ur POSITIVE (*)    All other components within normal limits  CBC WITH DIFFERENTIAL/PLATELET - Abnormal; Notable for the following:    HCT 34.7 (*)    All other components within normal limits  URINALYSIS, MICROSCOPIC (REFLEX) - Abnormal; Notable for the following:    Bacteria,  UA MANY (*)    Squamous Epithelial / LPF 6-30 (*)    All other components within normal limits  COMPREHENSIVE METABOLIC PANEL  HCG, QUANTITATIVE, PREGNANCY   Results for orders placed or performed during the hospital encounter of 11/25/16  Urinalysis, Routine w reflex microscopic  Result Value Ref Range   Color, Urine YELLOW YELLOW   APPearance CLOUDY (A) CLEAR   Specific Gravity, Urine 1.021 1.005 - 1.030   pH 7.0 5.0 - 8.0   Glucose, UA NEGATIVE NEGATIVE mg/dL   Hgb urine dipstick LARGE (A) NEGATIVE   Bilirubin Urine NEGATIVE NEGATIVE   Ketones, ur  NEGATIVE NEGATIVE mg/dL   Protein, ur 30 (A) NEGATIVE mg/dL   Nitrite NEGATIVE NEGATIVE   Leukocytes, UA TRACE (A) NEGATIVE  Pregnancy, urine  Result Value Ref Range   Preg Test, Ur POSITIVE (A) NEGATIVE  Comprehensive metabolic panel  Result Value Ref Range   Sodium 135 135 - 145 mmol/L   Potassium 3.8 3.5 - 5.1 mmol/L   Chloride 104 101 - 111 mmol/L   CO2 25 22 - 32 mmol/L   Glucose, Bld 90 65 - 99 mg/dL   BUN 11 6 - 20 mg/dL   Creatinine, Ser 1.19 0.44 - 1.00 mg/dL   Calcium 8.8 (L) 8.9 - 10.3 mg/dL   Total Protein 6.8 6.5 - 8.1 g/dL   Albumin 3.5 3.5 - 5.0 g/dL   AST 15 15 - 41 U/L   ALT 11 (L) 14 - 54 U/L   Alkaline Phosphatase 57 38 - 126 U/L   Total Bilirubin 0.5 0.3 - 1.2 mg/dL   GFR calc non Af Amer >60 >60 mL/min   GFR calc Af Amer >60 >60 mL/min   Anion gap 6 5 - 15  CBC with Differential/Platelet  Result Value Ref Range   WBC 5.8 4.0 - 10.5 K/uL   RBC 4.04 3.87 - 5.11 MIL/uL   Hemoglobin 12.0 12.0 - 15.0 g/dL   HCT 14.7 (L) 82.9 - 56.2 %   MCV 85.9 78.0 - 100.0 fL   MCH 29.7 26.0 - 34.0 pg   MCHC 34.6 30.0 - 36.0 g/dL   RDW 13.0 86.5 - 78.4 %   Platelets 272 150 - 400 K/uL   Neutrophils Relative % 53 %   Neutro Abs 3.1 1.7 - 7.7 K/uL   Lymphocytes Relative 37 %   Lymphs Abs 2.1 0.7 - 4.0 K/uL   Monocytes Relative 7 %   Monocytes Absolute 0.4 0.1 - 1.0 K/uL   Eosinophils Relative 3 %   Eosinophils Absolute 0.2 0.0 - 0.7 K/uL   Basophils Relative 0 %   Basophils Absolute 0.0 0.0 - 0.1 K/uL  hCG, quantitative, pregnancy  Result Value Ref Range   hCG, Beta Chain, Quant, S 17,468 (H) <5 mIU/mL  Urinalysis, Microscopic (reflex)  Result Value Ref Range   RBC / HPF 6-30 0 - 5 RBC/hpf   WBC, UA 0-5 0 - 5 WBC/hpf   Bacteria, UA MANY (A) NONE SEEN   Squamous Epithelial / LPF 6-30 (A) NONE SEEN   Mucous PRESENT    No results found.  EKG  EKG Interpretation None       Radiology No results found.  Procedures Procedures (including critical care  time)  Medications Ordered in ED Medications - No data to display   Initial Impression / Assessment and Plan / ED Course  I have reviewed the triage vital signs and the nursing notes.  Pertinent labs & imaging results  that were available during my care of the patient were reviewed by me and considered in my medical decision making (see chart for details).     Patient remained asymptomatic throughout her entire ED course. Of concern was elevated quantitative hCG. I consulted Dr. Alvester MorinNewton via telephone from GYN service who suggested the patient requires pelvic ultrasound prior to leaving here to check for possible ectopic pregnancy in light of the fact that there is no imaging available [facility]. Patient refused to stay for ultrasound evaluation despite my explaining to her risk of death if ectopic pregnancy present. She is capable of understanding the risks and of sound mind to make decision. She states she will return later for ultrasound. Dr. Alvester MorinNewton suggests that if ultrasound shows products of conception she should [facility]. She can go at 8 AM on Friday, June 8 to have her blood redrawn. If ultrasound shows no products of conception, she should be transferred to maternity admissions unit for further evaluation.  Final Clinical Impressions(s) / ED Diagnoses  Diagnosis right-sided abdominal pain Final diagnoses:  None    New Prescriptions New Prescriptions   No medications on file     Doug SouJacubowitz, Leolia Vinzant, MD 11/25/16 [phone]

## 2016-11-25 NOTE — ED Triage Notes (Signed)
C/o abd pain after eating x "weeks"-NAD-steady gait

## 2016-11-25 NOTE — ED Notes (Signed)
RN Neysa BonitoChristy said to go ahead and remove IV and that MD would be in a few minutes to speak with her.

## 2016-11-25 NOTE — ED Notes (Signed)
Pt left AMA d/t child care problems plans to return this PM

## 2016-11-30 ENCOUNTER — Other Ambulatory Visit: Payer: Self-pay

## 2016-12-10 ENCOUNTER — Other Ambulatory Visit: Payer: Self-pay

## 2017-03-29 ENCOUNTER — Encounter (HOSPITAL_BASED_OUTPATIENT_CLINIC_OR_DEPARTMENT_OTHER): Payer: Self-pay | Admitting: *Deleted

## 2017-03-29 ENCOUNTER — Emergency Department (HOSPITAL_BASED_OUTPATIENT_CLINIC_OR_DEPARTMENT_OTHER)
Admission: EM | Admit: 2017-03-29 | Discharge: 2017-03-29 | Disposition: A | Discharge status: Home / Self Care | Payer: Self-pay | Attending: Emergency Medicine | Admitting: Emergency Medicine

## 2017-03-29 ENCOUNTER — Emergency Department (HOSPITAL_BASED_OUTPATIENT_CLINIC_OR_DEPARTMENT_OTHER): Payer: Self-pay

## 2017-03-29 DIAGNOSIS — Z3201 Encounter for pregnancy test, result positive: Secondary | ICD-10-CM | POA: Insufficient documentation

## 2017-03-29 DIAGNOSIS — O4691 Antepartum hemorrhage, unspecified, first trimester: Secondary | ICD-10-CM | POA: Insufficient documentation

## 2017-03-29 DIAGNOSIS — Z3A00 Weeks of gestation of pregnancy not specified: Secondary | ICD-10-CM | POA: Insufficient documentation

## 2017-03-29 DIAGNOSIS — O4690 Antepartum hemorrhage, unspecified, unspecified trimester: Secondary | ICD-10-CM

## 2017-03-29 LAB — CBC
HEMATOCRIT: 37 % (ref 36.0–46.0)
HEMOGLOBIN: 12.6 g/dL (ref 12.0–15.0)
MCH: 29.2 pg (ref 26.0–34.0)
MCHC: 34.1 g/dL (ref 30.0–36.0)
MCV: 85.8 fL (ref 78.0–100.0)
Platelets: 267 10*3/uL (ref 150–400)
RBC: 4.31 MIL/uL (ref 3.87–5.11)
RDW: 13.1 % (ref 11.5–15.5)
WBC: 5.1 10*3/uL (ref 4.0–10.5)

## 2017-03-29 LAB — HCG, QUANTITATIVE, PREGNANCY: hCG, Beta Chain, Quant, S: 1825 m[IU]/mL — ABNORMAL HIGH (ref <5)

## 2017-03-29 LAB — URINALYSIS, ROUTINE W REFLEX MICROSCOPIC
Bilirubin Urine: NEGATIVE
Glucose, UA: NEGATIVE mg/dL
KETONES UR: 15 mg/dL — AB
Nitrite: NEGATIVE
PROTEIN: 30 mg/dL — AB
Specific Gravity, Urine: >1.03 — ABNORMAL HIGH (ref 1.005–1.030)
pH: 6 (ref 5.0–8.0)

## 2017-03-29 LAB — PREGNANCY, URINE: Preg Test, Ur: POSITIVE — AB

## 2017-03-29 LAB — URINALYSIS, MICROSCOPIC (REFLEX)

## 2017-03-29 NOTE — ED Provider Notes (Signed)
[facility]   CSN: [id number] Arrival date & time: 03/29/17  1241     History   Chief Complaint Chief Complaint  Patient presents with  . Vaginal Bleeding    HPI Tracey Elliott is a 31 y.o. female.  Patient is a 31 year old female who presents with vaginal bleeding. She states that she had a normal menstrual cycle September 14. She started bleeding again 5 days ago. She took a home pregnancy test that was positive. She still has some ongoing bleeding. She denies any associated abdominal pain. No dizziness. No fevers. She denies any urinary symptoms.      History reviewed. No pertinent past medical history.  There are no active problems to display for this patient.   Past Surgical History:  Procedure Laterality Date  . TONSILLECTOMY      OB History    Gravida Para Term Preterm AB Living   3 0 0 0 2 0   SAB TAB Ectopic Multiple Live Births   1 1 0 0 0       Home Medications    Prior to Admission medications   Not on File    Family History No family history on file.  Social History Social History  Substance Use Topics  . Smoking status: Never Smoker  . Smokeless tobacco: Never Used  . Alcohol use No     Allergies   Patient has no known allergies.   Review of Systems Review of Systems  Constitutional: Negative for chills, diaphoresis, fatigue and fever.  HENT: Negative for congestion, rhinorrhea and sneezing.   Eyes: Negative.   Respiratory: Negative for cough, chest tightness and shortness of breath.   Cardiovascular: Negative for chest pain and leg swelling.  Gastrointestinal: Negative for abdominal pain, blood in stool, diarrhea, nausea and vomiting.  Genitourinary: Positive for vaginal bleeding. Negative for difficulty urinating, flank pain, frequency, hematuria and vaginal discharge.  Musculoskeletal: Negative for arthralgias and back pain.  Skin: Negative for rash.  Neurological: Negative for dizziness, speech  difficulty, weakness, numbness and headaches.     Physical Exam Updated Vital Signs BP (!) 146/90 (BP Location: Right Arm)   Pulse 72   Temp 98.4 F (36.9 C) (Oral)   Resp 16   Ht  (1.575 m)   Wt 74.4 kg (164 lb)   LMP 03/05/2017   SpO2 100%   BMI 30.00 kg/m   Physical Exam  Constitutional: She is oriented to person, place, and time. She appears well-developed and well-nourished.  HENT:  Head: Normocephalic and atraumatic.  Eyes: Pupils are equal, round, and reactive to light.  Neck: Normal range of motion. Neck supple.  Cardiovascular: Normal rate, regular rhythm and normal heart sounds.   Pulmonary/Chest: Effort normal and breath sounds normal. No respiratory distress. She has no wheezes. She has no rales. She exhibits no tenderness.  Abdominal: Soft. Bowel sounds are normal. There is no tenderness. There is no rebound and no guarding.  Genitourinary:  Genitourinary Comments: Patient has dark blood in the vaginal vault. No active bleeding through the os is noted.  no cervical motion tenderness or adnexal tenderness  Musculoskeletal: Normal range of motion. She exhibits no edema.  Lymphadenopathy:    She has no cervical adenopathy.  Neurological: She is alert and oriented to person, place, and time.  Skin: Skin is warm and dry. No rash noted.  Psychiatric: She has a normal mood and affect.     ED Treatments / Results  Labs (all labs  ordered are listed, but only abnormal results are displayed) Labs Reviewed  URINALYSIS, ROUTINE W REFLEX MICROSCOPIC - Abnormal; Notable for the following:       Result Value   Color, Urine AMBER (*)    APPearance CLOUDY (*)    Specific Gravity, Urine >1.030 (*)    Hgb urine dipstick LARGE (*)    Ketones, ur 15 (*)    Protein, ur 30 (*)    Leukocytes, UA TRACE (*)    All other components within normal limits  PREGNANCY, URINE - Abnormal; Notable for the following:    Preg Test, Ur POSITIVE (*)    All other components within  normal limits  URINALYSIS, MICROSCOPIC (REFLEX) - Abnormal; Notable for the following:    Bacteria, UA MANY (*)    Squamous Epithelial / LPF 0-5 (*)    All other components within normal limits  HCG, QUANTITATIVE, PREGNANCY - Abnormal; Notable for the following:    hCG, Beta Chain, Quant, S 1,825 (*)    All other components within normal limits  CBC    EKG  EKG Interpretation None       Radiology US Ob Comp < 14 Wks  Result Date: 03/29/2017 CLINICAL DATA:  Vaginal bleeding and first-trimester pregnancy EXAM: OBSTETRIC <14 WK [place] AND TRANSVAGINAL OB US TECHNIQUE: Both transabdominal and transvaginal ultrasound examinations were performed for complete evaluation of the gestation as well as the maternal uterus, adnexal regions, and pelvic cul-de-sac. Transvaginal technique was performed to assess early pregnancy. COMPARISON:  11/12/2011 FINDINGS: No evidence of intra or extrauterine gestational sac. No adnexal extra ovarian adnexal mass or pelvic fluid. There are 2 submucosal uterine masses along the upper uterus measuring up to 1.8 cm. IMPRESSION: 1. Pregnancy of unknown location. Differential considerations include intrauterine gestation too early to be sonographically visualized, spontaneous abortion, or ectopic pregnancy. Consider follow-up ultrasound in 10 days and serial quantitative beta HCG follow-up. 2. No adnexal mass or pelvic fluid. 3. Two subserosal fibroids measuring up to 1.8 cm. Electronically Signed   By: Marnee Spring M.D.   On: 03/29/2017 15:54   US Ob Transvaginal  Result Date: 03/29/2017 CLINICAL DATA:  Vaginal bleeding and first-trimester pregnancy EXAM: OBSTETRIC <14 WK [place] AND TRANSVAGINAL OB US TECHNIQUE: Both transabdominal and transvaginal ultrasound examinations were performed for complete evaluation of the gestation as well as the maternal uterus, adnexal regions, and pelvic cul-de-sac. Transvaginal technique was performed to assess early pregnancy. COMPARISON:   11/12/2011 FINDINGS: No evidence of intra or extrauterine gestational sac. No adnexal extra ovarian adnexal mass or pelvic fluid. There are 2 submucosal uterine masses along the upper uterus measuring up to 1.8 cm. IMPRESSION: 1. Pregnancy of unknown location. Differential considerations include intrauterine gestation too early to be sonographically visualized, spontaneous abortion, or ectopic pregnancy. Consider follow-up ultrasound in 10 days and serial quantitative beta HCG follow-up. 2. No adnexal mass or pelvic fluid. 3. Two subserosal fibroids measuring up to 1.8 cm. Electronically Signed   By: Marnee Spring M.D.   On: 03/29/2017 15:54    Procedures Procedures (including critical care time)  Medications Ordered in ED Medications - No data to display   Initial Impression / Assessment and Plan / ED Course  I have reviewed the triage vital signs and the nursing notes.  Pertinent labs & imaging results that were available during my care of the patient were reviewed by me and considered in my medical decision making (see chart for details).     Patient presents with vaginal  bleeding with a positive pregnancy test. She has no associated abdominal pain. Pelvic ultrasound did not show any intrauterine pregnancy. Patient was instructed that she will need to go to the [facility] in 2 days to have her hCG rechecked. I gave her both information about possibly following up with the women's outpatient clinic or going to the [facility].  She was given ectopic precautions and strict precautions to go to the emergency department immediately if she has worsening bleeding, dizziness or abdominal pain.  Final Clinical Impressions(s) / ED Diagnoses   Final diagnoses:  Vaginal bleeding in pregnancy    New Prescriptions New Prescriptions   No medications on file     Rolan Bucco, MD 03/29/17 [phone]

## 2017-03-29 NOTE — ED Triage Notes (Signed)
Vaginal bleeding 5 days. States she had a positive home pregnancy test.

## 2017-03-29 NOTE — Discharge Instructions (Signed)
YOU NEED TO GO TO THE [facility] IN 2 DAYS TO HAVE YOUR HORMONE LEVELS RECHECKED.  YOU NEED TO GO TO THE EMERGENCY DEPARTMENT IMMEDIATELY IF YOU HAVE HEAVIER BLEEDING, DIZZINESS, OR ABDOMINAL PAIN.

## 2017-03-29 NOTE — ED Notes (Signed)
Patient transported to Ultrasound 

## 2017-04-01 ENCOUNTER — Encounter: Payer: Self-pay | Admitting: Family Medicine

## 2017-04-01 ENCOUNTER — Ambulatory Visit: Payer: Self-pay

## 2017-04-01 DIAGNOSIS — O3680X0 Pregnancy with inconclusive fetal viability, not applicable or unspecified: Secondary | ICD-10-CM

## 2017-04-01 NOTE — Progress Notes (Signed)
Pt here today for rpt beta f/u from ED visit on 03/29/17 for vaginal bleeding no abdominal pain.  Notified Dr. Earlene Plater, recommended that if she continues to have no pain just some spotting beta lab does not need to be STAT.  Pt reports continued vaginal spotting with no pain.  Rpt beta drawn today.  Pt advised that she will here back with f/u by end of day Monday due to the weekend and given ectopic precautions.  Pt stated understanding with no further questions.

## 2017-04-02 LAB — BETA HCG QUANT (REF LAB): HCG QUANT: 935 m[IU]/mL

## 2017-04-08 ENCOUNTER — Ambulatory Visit: Payer: Self-pay

## 2017-06-18 ENCOUNTER — Other Ambulatory Visit: Payer: Self-pay

## 2017-06-18 ENCOUNTER — Encounter (HOSPITAL_BASED_OUTPATIENT_CLINIC_OR_DEPARTMENT_OTHER): Payer: Self-pay | Admitting: *Deleted

## 2017-06-18 ENCOUNTER — Emergency Department (HOSPITAL_BASED_OUTPATIENT_CLINIC_OR_DEPARTMENT_OTHER)
Admission: EM | Admit: 2017-06-18 | Discharge: 2017-06-18 | Disposition: A | Discharge status: Home / Self Care | Payer: Self-pay | Attending: Emergency Medicine | Admitting: Emergency Medicine

## 2017-06-18 DIAGNOSIS — L03213 Periorbital cellulitis: Secondary | ICD-10-CM | POA: Insufficient documentation

## 2017-06-18 MED ORDER — CEPHALEXIN 500 MG PO CAPS: 500.0000 mg | ORAL_CAPSULE | Freq: Four times a day (QID) | ORAL | 0 refills | Status: AC

## 2017-06-18 MED ORDER — TETRACAINE HCL 0.5 % OP SOLN
2.0000 [drp] | Freq: Once | OPHTHALMIC | Status: DC
  Filled 2017-06-18: qty 4

## 2017-06-18 MED ORDER — CEPHALEXIN 250 MG PO CAPS
500.0000 mg | ORAL_CAPSULE | Freq: Once | ORAL | Status: AC
  Administered 2017-06-18: 500 mg via ORAL
  Filled 2017-06-18: qty 2

## 2017-06-18 MED ORDER — FLUORESCEIN SODIUM 1 MG OP STRP
1.0000 | ORAL_STRIP | Freq: Once | OPHTHALMIC | Status: DC
  Filled 2017-06-18: qty 1

## 2017-06-18 NOTE — ED Triage Notes (Signed)
Left eye is swollen and itching. She has been using otc eye stye relief.

## 2017-06-18 NOTE — ED Provider Notes (Signed)
[facility] EMERGENCY DEPARTMENT Provider Note   CSN: [id number] Arrival date & time: 06/18/17  2229     History   Chief Complaint Chief Complaint  Patient presents with  . Eye Problem    HPI Tracey Elliott is a 31 y.o. female.  Patient is a 31 year old female with no significant past medical history.  She presents today for evaluation of swelling around her left eye.  This was present when she woke this morning and began in the absence of any injury or trauma.  She denies any new contacts or exposures.  She denies any discharge from the eye.  She denies any visual disturbances.  She does wear contact lenses, however has not worn these for several weeks.   The history is provided by the patient.  Eye Problem   This is a new problem. Episode onset: This morning. The problem occurs constantly. The problem has been gradually worsening. There is a problem in the left eye. There was no injury mechanism. The patient is experiencing no pain. There is no history of trauma to the eye. Pertinent negatives include no blurred vision, no discharge, no double vision, no foreign body sensation, no photophobia and no eye redness.    History reviewed. No pertinent past medical history.  There are no active problems to display for this patient.   Past Surgical History:  Procedure Laterality Date  . TONSILLECTOMY      OB History    Gravida Para Term Preterm AB Living   3 0 0 0 2 0   SAB TAB Ectopic Multiple Live Births   1 1 0 0 0       Home Medications    Prior to Admission medications   Not on File    Family History No family history on file.  Social History Social History   Tobacco Use  . Smoking status: Never Smoker  . Smokeless tobacco: Never Used  Substance Use Topics  . Alcohol use: No  . Drug use: No     Allergies   Patient has no known allergies.   Review of Systems Review of Systems  Eyes: Negative for blurred vision, double vision,  photophobia, discharge and redness.  All other systems reviewed and are negative.    Physical Exam Updated Vital Signs BP (!) 140/101   Pulse 67   Temp 97.8 F (36.6 C) (Oral)   Resp 16   Ht 5\' 3"  (1.6 m)   Wt 74.4 kg (164 lb)   LMP 06/11/2017   SpO2 100%   BMI 29.05 kg/m   Physical Exam  Constitutional: She is oriented to person, place, and time. She appears well-developed and well-nourished. No distress.  HENT:  Head: Normocephalic and atraumatic.  Eyes:  There is redness and swelling to the periorbital soft tissues of the left eye.  She has full range of motion of the eyes without any discomfort.  There is no conjunctival injection or discharge.  There is no obvious stye on the eyelid.  Neck: Normal range of motion. Neck supple.  Pulmonary/Chest: Effort normal.  Neurological: She is alert and oriented to person, place, and time.  Skin: She is not diaphoretic.  Nursing note and vitals reviewed.    ED Treatments / Results  Labs (all labs ordered are listed, but only abnormal results are displayed) Labs Reviewed - No data to display  EKG  EKG Interpretation None       Radiology No results found.  Procedures Procedures (including critical  care time)  Medications Ordered in ED Medications  tetracaine (PONTOCAINE) 0.5 % ophthalmic solution 2 drop (not administered)  fluorescein ophthalmic strip 1 strip (not administered)  cephALEXin (KEFLEX) capsule 500 mg (not administered)     Initial Impression / Assessment and Plan / ED Course  I have reviewed the triage vital signs and the nursing notes.  Pertinent labs & imaging results that were available during my care of the patient were reviewed by me and considered in my medical decision making (see chart for details).  This appears to be a periorbital cellulitis.  Will be treated with Keflex and return as needed.  Final Clinical Impressions(s) / ED Diagnoses   Final diagnoses:  None    ED Discharge  Orders    None       Geoffery Lyonselo, [place] Holian, MD 06/18/17 2337

## 2017-06-18 NOTE — ED Notes (Signed)
Visual acuity with glasses. Right eye 20/25 left eye 20/40

## 2017-06-18 NOTE — Discharge Instructions (Signed)
Keflex as prescribed.  Apply warm compresses every few hours for the next 2 days.  Return to the emergency department for worsening swelling, severe pain, visual disturbances, or other new and concerning symptoms.

## 2019-02-13 IMAGING — US US OB COMP LESS 14 WK
1 series · 14 of 28 positions shown · non-contrast
Comparison: 11/12/2011

CLINICAL DATA: Vaginal bleeding and first-trimester pregnancy

EXAM:
OBSTETRIC <14 WK US AND TRANSVAGINAL OB US
TECHNIQUE: Both transabdominal and transvaginal ultrasound examinations were
performed for complete evaluation of the gestation as well as the
maternal uterus, adnexal regions, and pelvic cul-de-sac.
Transvaginal technique was performed to assess early pregnancy.

[Series 1: us ob comp less 14 wk · 0.17mm/px · 14 of 90 slices shown]
[im 4/90]
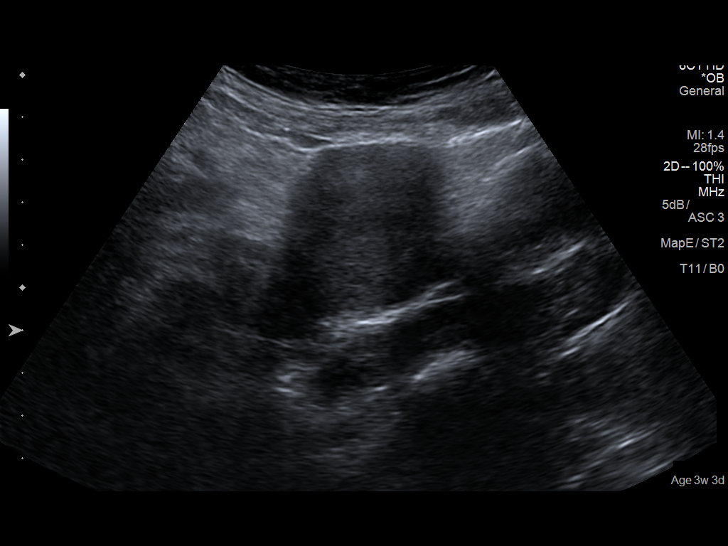
[im 10/90]
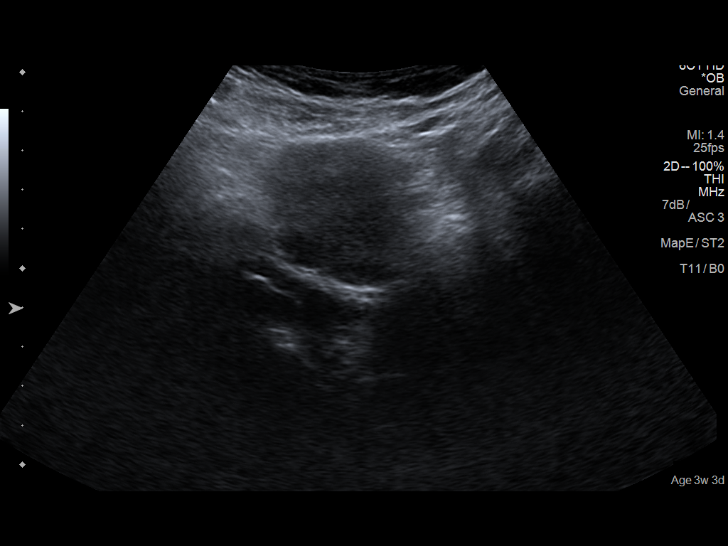
[im 17/90]
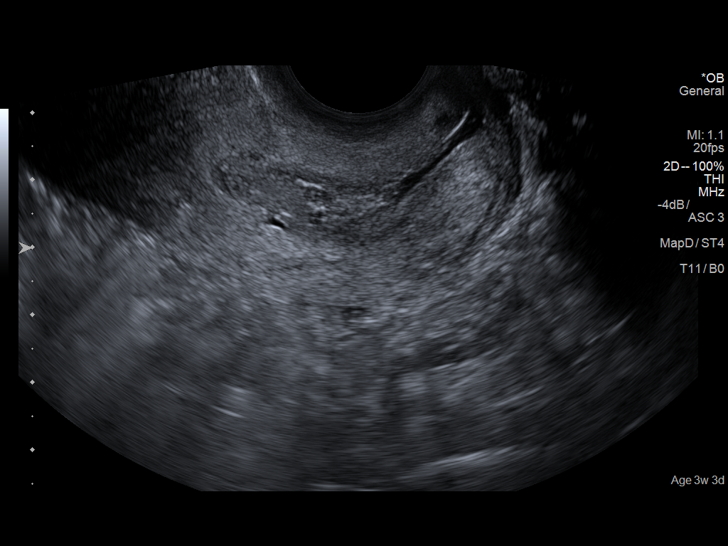
[im 24/90]
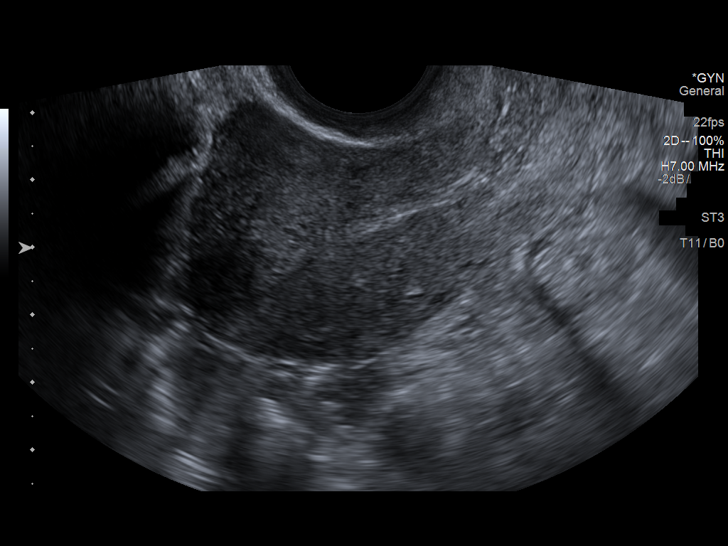
[im 30/90]
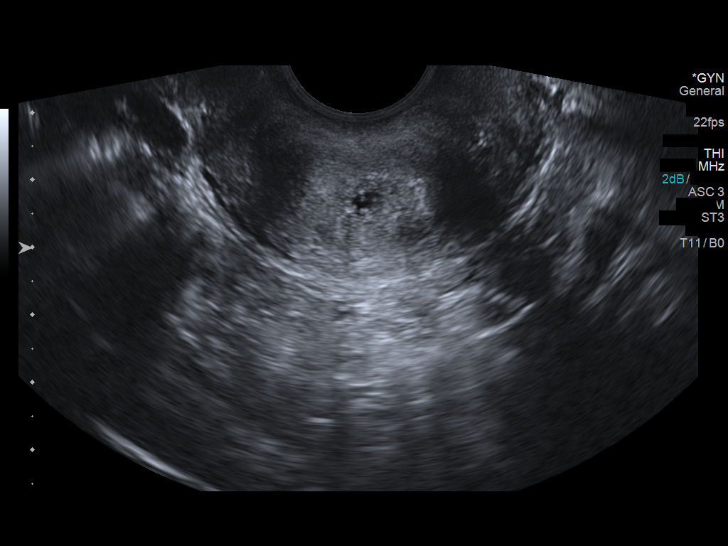
[im 37/90]
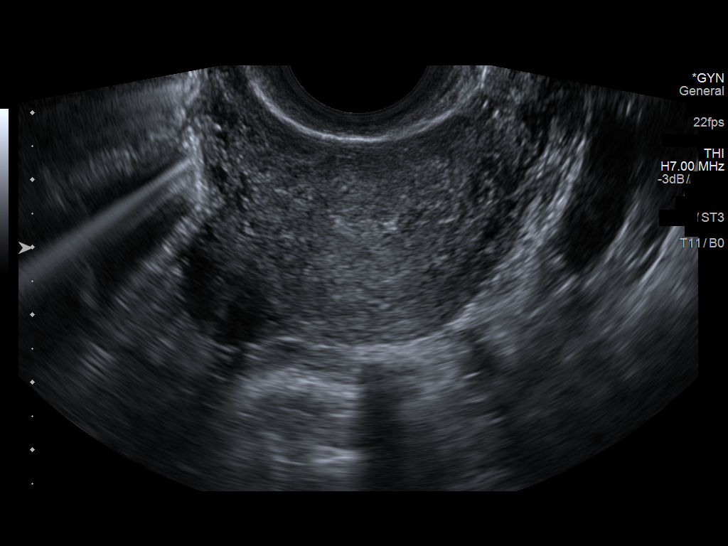
[im 43/90]
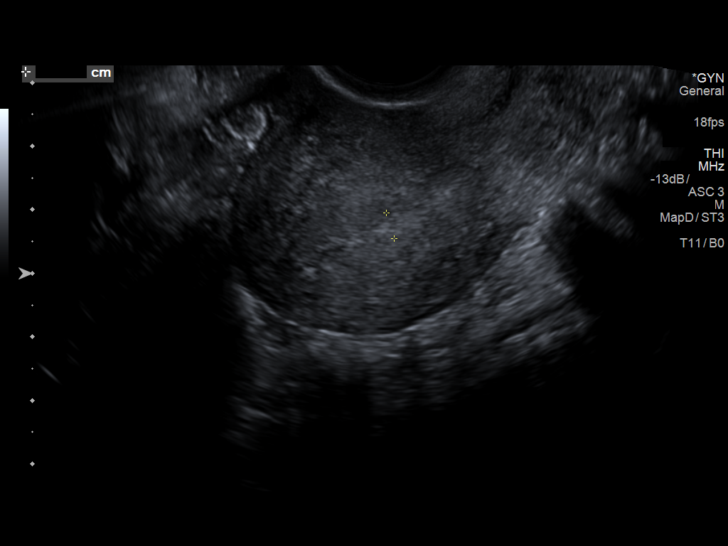
[im 50/90]
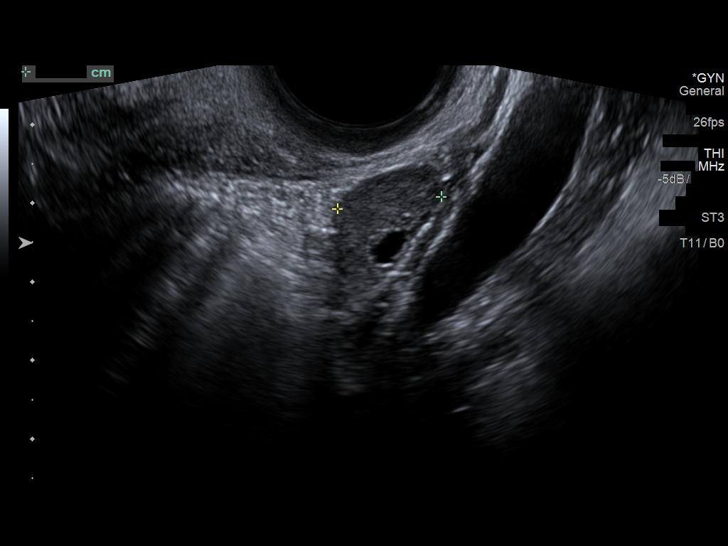
[im 57/90]
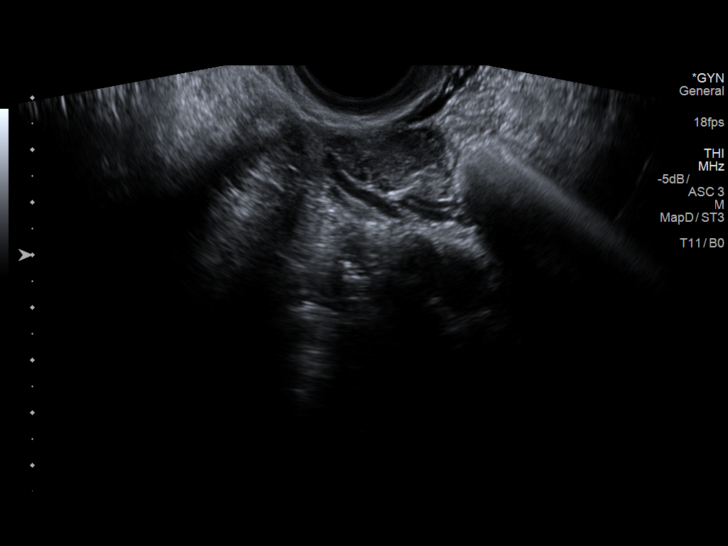
[im 63/90]
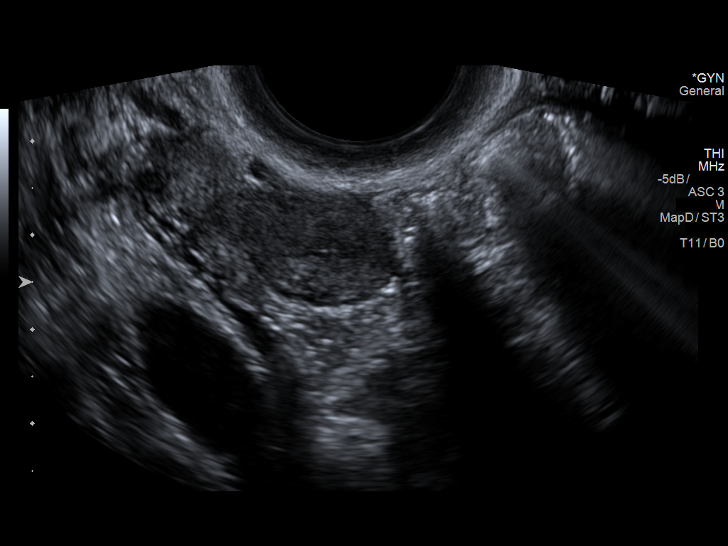
[im 70/90]
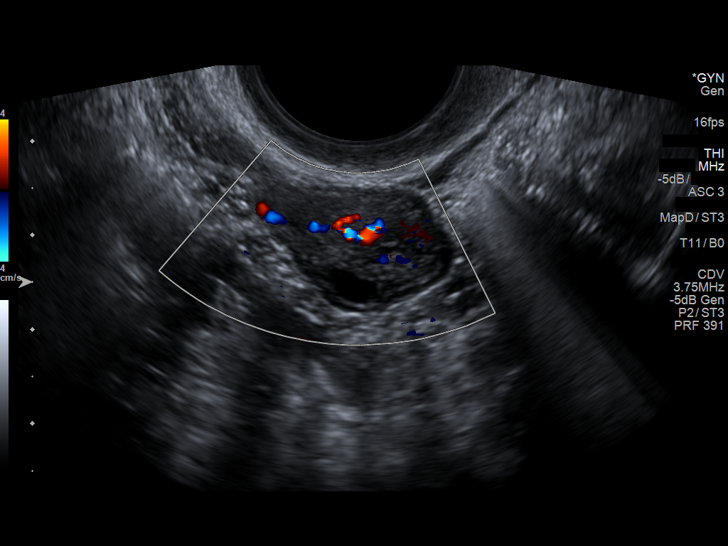
[im 76/90]
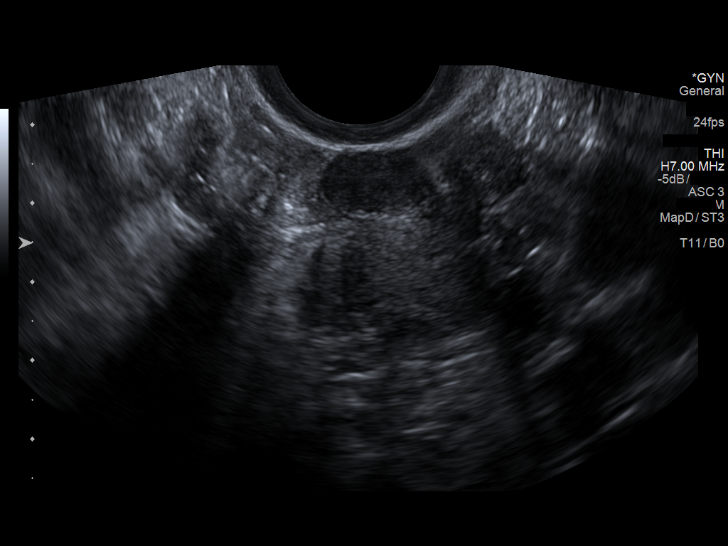
[im 83/90]
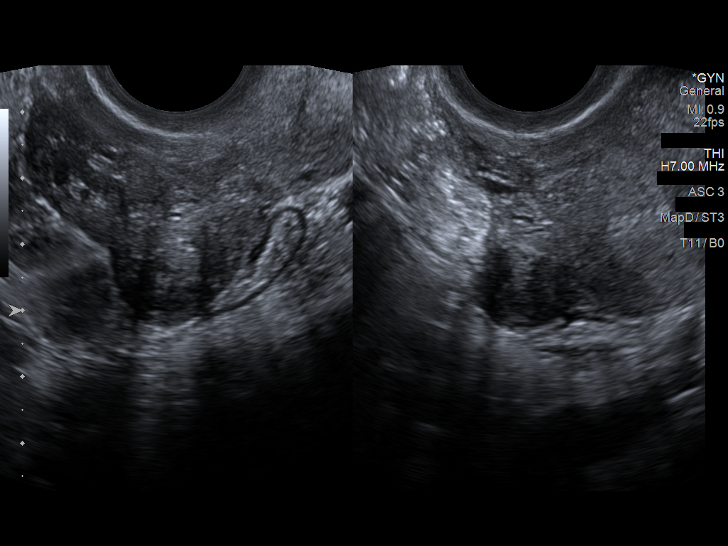
[im 90/90]
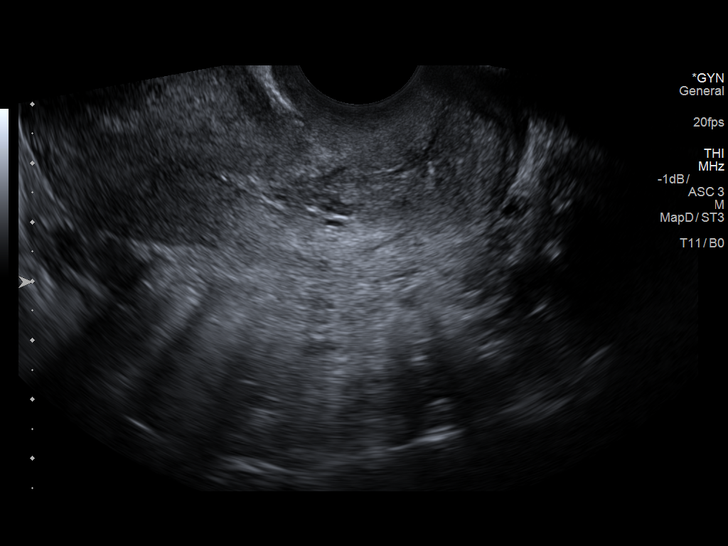

[14 of 28 positions shown; findings below may reference images not displayed]

FINDINGS: No evidence of intra or extrauterine gestational sac. No adnexal
extra ovarian adnexal mass or pelvic fluid. There are 2 submucosal
uterine masses along the upper uterus measuring up to 1.8 cm.
IMPRESSION: 1. Pregnancy of unknown location. Differential considerations
include intrauterine gestation too early to be sonographically
visualized, spontaneous abortion, or ectopic pregnancy. Consider
follow-up ultrasound in 10 days and serial quantitative beta HCG
follow-up.
2. No adnexal mass or pelvic fluid.
3. Two subserosal fibroids measuring up to 1.8 cm.

## 2021-01-22 ENCOUNTER — Encounter: Payer: Self-pay | Admitting: Obstetrics & Gynecology

## 2021-01-22 ENCOUNTER — Encounter: Payer: Self-pay | Admitting: General Practice

## 2021-01-24 ENCOUNTER — Encounter (HOSPITAL_BASED_OUTPATIENT_CLINIC_OR_DEPARTMENT_OTHER): Payer: Self-pay | Admitting: Emergency Medicine

## 2021-01-24 ENCOUNTER — Emergency Department (HOSPITAL_BASED_OUTPATIENT_CLINIC_OR_DEPARTMENT_OTHER): Payer: Medicaid Other

## 2021-01-24 ENCOUNTER — Emergency Department (HOSPITAL_BASED_OUTPATIENT_CLINIC_OR_DEPARTMENT_OTHER)
Admission: EM | Admit: 2021-01-24 | Discharge: 2021-01-24 | Disposition: A | Discharge status: Home / Self Care | Payer: Medicaid Other | Attending: Emergency Medicine | Admitting: Emergency Medicine

## 2021-01-24 ENCOUNTER — Other Ambulatory Visit: Payer: Self-pay

## 2021-01-24 DIAGNOSIS — N898 Other specified noninflammatory disorders of vagina: Secondary | ICD-10-CM | POA: Diagnosis not present

## 2021-01-24 DIAGNOSIS — O23591 Infection of other part of genital tract in pregnancy, first trimester: Secondary | ICD-10-CM | POA: Diagnosis not present

## 2021-01-24 DIAGNOSIS — Z3A13 13 weeks gestation of pregnancy: Secondary | ICD-10-CM | POA: Diagnosis not present

## 2021-01-24 DIAGNOSIS — B9689 Other specified bacterial agents as the cause of diseases classified elsewhere: Secondary | ICD-10-CM | POA: Insufficient documentation

## 2021-01-24 DIAGNOSIS — O26891 Other specified pregnancy related conditions, first trimester: Secondary | ICD-10-CM | POA: Diagnosis present

## 2021-01-24 DIAGNOSIS — O26899 Other specified pregnancy related conditions, unspecified trimester: Secondary | ICD-10-CM

## 2021-01-24 LAB — CBC WITH DIFFERENTIAL/PLATELET
Abs Immature Granulocytes: 0.03 10*3/uL (ref 0.00–0.07)
Basophils Absolute: 0 10*3/uL (ref 0.0–0.1)
Basophils Relative: 0 %
Eosinophils Absolute: 0.1 10*3/uL (ref 0.0–0.5)
Eosinophils Relative: 1 %
HCT: 35.1 % — ABNORMAL LOW (ref 36.0–46.0)
Hemoglobin: 12.2 g/dL (ref 12.0–15.0)
Immature Granulocytes: 0 %
Lymphocytes Relative: 20 %
Lymphs Abs: 1.7 10*3/uL (ref 0.7–4.0)
MCH: 29.5 pg (ref 26.0–34.0)
MCHC: 34.8 g/dL (ref 30.0–36.0)
MCV: 85 fL (ref 80.0–100.0)
Monocytes Absolute: 0.6 10*3/uL (ref 0.1–1.0)
Monocytes Relative: 8 %
Neutro Abs: 5.7 10*3/uL (ref 1.7–7.7)
Neutrophils Relative %: 71 %
Platelets: 283 10*3/uL (ref 150–400)
RBC: 4.13 MIL/uL (ref 3.87–5.11)
RDW: 13.2 % (ref 11.5–15.5)
WBC: 8.1 10*3/uL (ref 4.0–10.5)
nRBC: 0 % (ref 0.0–0.2)

## 2021-01-24 LAB — COMPREHENSIVE METABOLIC PANEL
ALT: 11 U/L (ref 0–44)
AST: 16 U/L (ref 15–41)
Albumin: 3.5 g/dL (ref 3.5–5.0)
Alkaline Phosphatase: 51 U/L (ref 38–126)
Anion gap: 6 (ref 5–15)
BUN: 9 mg/dL (ref 6–20)
CO2: 26 mmol/L (ref 22–32)
Calcium: 9.3 mg/dL (ref 8.9–10.3)
Chloride: 102 mmol/L (ref 98–111)
Creatinine, Ser: 0.63 mg/dL (ref 0.44–1.00)
GFR, Estimated: >60 mL/min (ref >60)
Glucose, Bld: 108 mg/dL — ABNORMAL HIGH (ref 70–99)
Potassium: 3.5 mmol/L (ref 3.5–5.1)
Sodium: 134 mmol/L — ABNORMAL LOW (ref 135–145)
Total Bilirubin: 0.2 mg/dL — ABNORMAL LOW (ref 0.3–1.2)
Total Protein: 7 g/dL (ref 6.5–8.1)

## 2021-01-24 LAB — URINALYSIS, ROUTINE W REFLEX MICROSCOPIC
Bilirubin Urine: NEGATIVE
Glucose, UA: NEGATIVE mg/dL
Hgb urine dipstick: NEGATIVE
Ketones, ur: NEGATIVE mg/dL
Leukocytes,Ua: NEGATIVE
Nitrite: NEGATIVE
Protein, ur: NEGATIVE mg/dL
Specific Gravity, Urine: 1.015 (ref 1.005–1.030)
pH: 7 (ref 5.0–8.0)

## 2021-01-24 LAB — WET PREP, GENITAL
Sperm: NONE SEEN
Trich, Wet Prep: NONE SEEN
WBC, Wet Prep HPF POC: NONE SEEN
Yeast Wet Prep HPF POC: NONE SEEN

## 2021-01-24 LAB — HCG, QUANTITATIVE, PREGNANCY: hCG, Beta Chain, Quant, S: 158698 m[IU]/mL — ABNORMAL HIGH (ref <5)

## 2021-01-24 MED ORDER — METRONIDAZOLE 500 MG PO TABS: 500.0000 mg | ORAL_TABLET | Freq: Two times a day (BID) | ORAL | 0 refills | Status: AC

## 2021-01-24 NOTE — ED Provider Notes (Addendum)
[facility] EMERGENCY DEPARTMENT Provider Note   CSN: [id number] Arrival date & time: 01/24/21  1418     History Chief Complaint  Patient presents with   Abdominal Pain    Tracey Elliott is a 35 y.o. female G1P0 [redacted]w[redacted]d presents for evaluating of lower abd cramping which began yesterday. Located to suprapubic region.  Mild dysuria however no hematuria, frequency.  No bleeding, vaginal discharge.  No fever, chills, nausea, vomiting, chest pain, flank pain, weakness.  Follows with [facility].  No additional aggravating or alleviating factors.   O positive  History obtained from patient and past medical records.  No interpreter used.  HPI     History reviewed. No pertinent past medical history.  There are no problems to display for this patient.   Past Surgical History:  Procedure Laterality Date   TONSILLECTOMY       OB History     Gravida  4   Para  0   Term  0   Preterm  0   AB  2   Living  0      SAB  1   IAB  1   Ectopic  0   Multiple  0   Live Births  0           No family history on file.  Social History   Tobacco Use   Smoking status: Never   Smokeless tobacco: Never  Vaping Use   Vaping Use: Never used  Substance Use Topics   Alcohol use: No   Drug use: No    Home Medications Prior to Admission medications   Medication Sig Start Date End Date Taking? Authorizing Provider  metroNIDAZOLE (FLAGYL) 500 MG tablet Take 1 tablet (500 mg total) by mouth 2 (two) times daily for 7 days. 01/24/21 01/31/21 Yes Anayiah Howden A, PA-C  cephALEXin (KEFLEX) 500 MG capsule Take 1 capsule (500 mg total) by mouth 4 (four) times daily. 06/18/17   Geoffery Lyons, MD    Allergies    Patient has no known allergies.  Review of Systems   Review of Systems  Constitutional: Negative.   HENT: Negative.    Respiratory: Negative.    Cardiovascular: Negative.   Gastrointestinal:  Positive for abdominal pain. Negative for constipation,  diarrhea, nausea and vomiting.  Genitourinary:  Positive for dysuria. Negative for decreased urine volume, difficulty urinating, flank pain, frequency, hematuria, menstrual problem, pelvic pain, urgency, vaginal bleeding, vaginal discharge and vaginal pain.  Musculoskeletal: Negative.   Neurological: Negative.   All other systems reviewed and are negative.  Physical Exam Updated Vital Signs BP 137/85 (BP Location: Left Arm)   Pulse 77   Temp 98.5 F (36.9 C) (Oral)   Resp 14   Ht 5\' 2"  (1.575 m)   Wt 75.8 kg   SpO2 100%   BMI 30.54 kg/m   Physical Exam Vitals and nursing note reviewed.  Constitutional:      General: She is not in acute distress.    Appearance: She is well-developed. She is not ill-appearing, toxic-appearing or diaphoretic.  HENT:     Head: Atraumatic.  Eyes:     Pupils: Pupils are equal, round, and reactive to light.  Cardiovascular:     Rate and Rhythm: Normal rate.     Heart sounds: Normal heart sounds.  Pulmonary:     Effort: Pulmonary effort is normal. No respiratory distress.     Breath sounds: Normal breath sounds.  Abdominal:     General:  Bowel sounds are normal. There is no distension.     Palpations: Abdomen is soft.     Tenderness: There is no abdominal tenderness.     Hernia: No hernia is present.  Genitourinary:    Comments: Normal appearing external female genitalia without rashes or lesions, normal vaginal epithelium. Normal appearing cervix with moderate white discharge. No cervical petechiae. Cervical os is closed. There is  bleeding noted at the os. No  Odor. Bimanual: No CMT,  non-tender.  No palpable adnexal masses or tenderness. Uterus midline and not fixed. Rectovaginal exam was deferred.  No cystocele or rectocele noted. No pelvic lymphadenopathy noted. Wet prep was obtained.  Cultures for gonorrhea and chlamydia collected. Exam performed with chaperone in room.   Musculoskeletal:        General: Normal range of motion.     Cervical  back: Normal range of motion.  Skin:    General: Skin is warm and dry.  Neurological:     General: No focal deficit present.     Mental Status: She is alert.  Psychiatric:        Mood and Affect: Mood normal.    ED Results / Procedures / Treatments   Labs (all labs ordered are listed, but only abnormal results are displayed) Labs Reviewed  WET PREP, GENITAL - Abnormal; Notable for the following components:      Result Value   Clue Cells Wet Prep HPF POC PRESENT (*)    All other components within normal limits  URINALYSIS, ROUTINE W REFLEX MICROSCOPIC - Abnormal; Notable for the following components:   Color, Urine STRAW (*)    All other components within normal limits  CBC WITH DIFFERENTIAL/PLATELET - Abnormal; Notable for the following components:   HCT 35.1 (*)    All other components within normal limits  COMPREHENSIVE METABOLIC PANEL - Abnormal; Notable for the following components:   Sodium 134 (*)    Glucose, Bld 108 (*)    Total Bilirubin 0.2 (*)    All other components within normal limits  HCG, QUANTITATIVE, PREGNANCY - Abnormal; Notable for the following components:   hCG, Beta Chain, Quant, S 604,540 (*)    All other components within normal limits  GC/CHLAMYDIA PROBE AMP () NOT AT [facility]    EKG None  Radiology US OB Limited  Result Date: 01/24/2021 CLINICAL DATA:  Pregnant abdominal pain EXAM: OBSTETRIC <14 WK ULTRASOUND TECHNIQUE: Transabdominal ultrasound was performed for evaluation of the gestation as well as the maternal uterus and adnexal regions. COMPARISON:  None. FINDINGS: Intrauterine gestational sac: Single Yolk sac:  Not Visualized. Embryo:  Not Visualized. Cardiac Activity: Not Visualized. Heart Rate: 145 bpm CRL:   66.5 mm   13 w 0 d                  [place] EDC: 07/28/2021 Subchorionic hemorrhage:  None visualized. Maternal uterus/adnexae: Uterine fibroids. IMPRESSION: 1. Single intrauterine gestation at sonographic gestational age [redacted] weeks, 0  days. Fetal heart rate 145 bpm. EDD 07/28/2021. 2.  Uterine fibroids. Electronically Signed   By: Lauralyn Primes M.D.   On: 01/24/2021 17:07    Procedures Procedures   Medications Ordered in ED Medications - No data to display  ED Course  I have reviewed the triage vital signs and the nursing notes.  Pertinent labs & imaging results that were available during my care of the patient were reviewed by me and considered in my medical decision making (see chart for details).  Here for evaluation of lower abdominal cramping in setting of pregnancy.  Approximately [redacted] weeks pregnant.  No vaginal discharge or bleeding.  No urinary complaints.  Heart and lungs clear.  Abdomen soft, nontender.  GU exam with closed cervical os.  She does have copious white discharge.  No CMT or adnexal tenderness.  Wet prep shows BV.  We will treat.  Labs without significant abnormality.  UA negative for infection.  Ultrasound with 13-week 0-day IUP without any complication.  Does not need RhoGAM.  Unclear etiology of patient's abdominal pain.  We will have her follow-up with OB.  Return for any worsening symptoms   On repeat exam patient does not have a surgical abdomin and there are no peritoneal signs.  No indication of appendicitis, bowel obstruction, bowel perforation, cholecystitis, diverticulitis, PID or ectopic pregnancy.   The patient has been appropriately medically screened and/or stabilized in the ED. I have low suspicion for any other emergent medical condition which would require further screening, evaluation or treatment in the ED or require inpatient management.  Patient is hemodynamically stable and in no acute distress.  Patient able to ambulate in department prior to ED.  Evaluation does not show acute pathology that would require ongoing or additional emergent interventions while in the emergency department or further inpatient treatment.  I have discussed the diagnosis with the patient and answered all  questions.  Pain is been managed while in the emergency department and patient has no further complaints prior to discharge.  Patient is comfortable with plan discussed in room and is stable for discharge at this time.  I have discussed strict return precautions for returning to the emergency department.  Patient was encouraged to follow-up with PCP/specialist refer to at discharge.     MDM Rules/Calculators/A&P                            Final Clinical Impression(s) / ED Diagnoses Final diagnoses:  Abdominal pain during pregnancy in first trimester  Bacterial vaginosis    Rx / DC Orders ED Discharge Orders          Ordered    metroNIDAZOLE (FLAGYL) 500 MG tablet  2 times daily        01/24/21 1744             Tiffaney Heimann A, PA-C 01/24/21 1748    Verbena Boeding A, PA-C 01/24/21 1749    Curatolo, Adam, DO 01/25/21 1027

## 2021-01-24 NOTE — ED Notes (Signed)
Pt states dysuria since last night ,  Denies vag d/c , denies cramping or wetness, GYN Ob  [place], last visit  2 weeks ago m, Feb 11 EDC , G 1 P 0 A 1 L 0

## 2021-01-24 NOTE — Discharge Instructions (Addendum)
Your ultrasound showed a single intrauterine pregnancy at [redacted] weeks pregnant.  There were no complications on your ultrasound.  Your urine did not show any evidence of infection.  Unclear why you have been having this pain.  I would follow-up with your OB/GYN or return for any new or worsening symptoms.  Wet prep did show bacterial vaginosis which is just an overgrowth of the normal bacteria that lives in the vagina.  This is not an STD.  I have written you for some antibiotics for this.  Follow-up with OB/GYN

## 2021-01-24 NOTE — ED Triage Notes (Signed)
Reports lower abdominal pressure that started while she was sleeping last night.  She is [redacted] weeks pregnant.  Denies any vaginal discharge or bleeding.

## 2021-01-27 LAB — GC/CHLAMYDIA PROBE AMP (~~LOC~~) NOT AT ARMC
Chlamydia: NEGATIVE
Comment: NEGATIVE
Comment: NORMAL
Neisseria Gonorrhea: NEGATIVE

## 2021-03-17 ENCOUNTER — Encounter (HOSPITAL_BASED_OUTPATIENT_CLINIC_OR_DEPARTMENT_OTHER): Payer: Self-pay

## 2021-03-17 ENCOUNTER — Emergency Department (HOSPITAL_BASED_OUTPATIENT_CLINIC_OR_DEPARTMENT_OTHER)
Admission: EM | Admit: 2021-03-17 | Discharge: 2021-03-17 | Disposition: A | Discharge status: Home / Self Care | Payer: Medicaid Other | Attending: Emergency Medicine | Admitting: Emergency Medicine

## 2021-03-17 ENCOUNTER — Other Ambulatory Visit: Payer: Self-pay

## 2021-03-17 DIAGNOSIS — Z3A20 20 weeks gestation of pregnancy: Secondary | ICD-10-CM | POA: Diagnosis not present

## 2021-03-17 DIAGNOSIS — R1030 Lower abdominal pain, unspecified: Secondary | ICD-10-CM | POA: Diagnosis not present

## 2021-03-17 DIAGNOSIS — O23592 Infection of other part of genital tract in pregnancy, second trimester: Secondary | ICD-10-CM | POA: Insufficient documentation

## 2021-03-17 DIAGNOSIS — N760 Acute vaginitis: Secondary | ICD-10-CM

## 2021-03-17 DIAGNOSIS — B9689 Other specified bacterial agents as the cause of diseases classified elsewhere: Secondary | ICD-10-CM

## 2021-03-17 DIAGNOSIS — O26892 Other specified pregnancy related conditions, second trimester: Secondary | ICD-10-CM | POA: Diagnosis present

## 2021-03-17 LAB — CBC WITH DIFFERENTIAL/PLATELET
Abs Immature Granulocytes: 0.08 10*3/uL — ABNORMAL HIGH (ref 0.00–0.07)
Basophils Absolute: 0 10*3/uL (ref 0.0–0.1)
Basophils Relative: 0 %
Eosinophils Absolute: 0.1 10*3/uL (ref 0.0–0.5)
Eosinophils Relative: 1 %
HCT: 31.1 % — ABNORMAL LOW (ref 36.0–46.0)
Hemoglobin: 10.9 g/dL — ABNORMAL LOW (ref 12.0–15.0)
Immature Granulocytes: 1 %
Lymphocytes Relative: 20 %
Lymphs Abs: 1.6 10*3/uL (ref 0.7–4.0)
MCH: 29.9 pg (ref 26.0–34.0)
MCHC: 35 g/dL (ref 30.0–36.0)
MCV: 85.2 fL (ref 80.0–100.0)
Monocytes Absolute: 0.6 10*3/uL (ref 0.1–1.0)
Monocytes Relative: 7 %
Neutro Abs: 5.7 10*3/uL (ref 1.7–7.7)
Neutrophils Relative %: 71 %
Platelets: 281 10*3/uL (ref 150–400)
RBC: 3.65 MIL/uL — ABNORMAL LOW (ref 3.87–5.11)
RDW: 13.2 % (ref 11.5–15.5)
WBC: 8.1 10*3/uL (ref 4.0–10.5)
nRBC: 0 % (ref 0.0–0.2)

## 2021-03-17 LAB — LIPASE, BLOOD: Lipase: 23 U/L (ref 11–51)

## 2021-03-17 LAB — WET PREP, GENITAL
Sperm: NONE SEEN
Trich, Wet Prep: NONE SEEN
Yeast Wet Prep HPF POC: NONE SEEN

## 2021-03-17 LAB — COMPREHENSIVE METABOLIC PANEL
ALT: 10 U/L (ref 0–44)
AST: 18 U/L (ref 15–41)
Albumin: 2.8 g/dL — ABNORMAL LOW (ref 3.5–5.0)
Alkaline Phosphatase: 62 U/L (ref 38–126)
Anion gap: 5 (ref 5–15)
BUN: 8 mg/dL (ref 6–20)
CO2: 25 mmol/L (ref 22–32)
Calcium: 8.7 mg/dL — ABNORMAL LOW (ref 8.9–10.3)
Chloride: 104 mmol/L (ref 98–111)
Creatinine, Ser: 0.48 mg/dL (ref 0.44–1.00)
GFR, Estimated: >60 mL/min (ref >60)
Glucose, Bld: 95 mg/dL (ref 70–99)
Potassium: 3.9 mmol/L (ref 3.5–5.1)
Sodium: 134 mmol/L — ABNORMAL LOW (ref 135–145)
Total Bilirubin: 0.1 mg/dL — ABNORMAL LOW (ref 0.3–1.2)
Total Protein: 6.5 g/dL (ref 6.5–8.1)

## 2021-03-17 LAB — URINALYSIS, ROUTINE W REFLEX MICROSCOPIC
Bilirubin Urine: NEGATIVE
Glucose, UA: NEGATIVE mg/dL
Hgb urine dipstick: NEGATIVE
Ketones, ur: NEGATIVE mg/dL
Leukocytes,Ua: NEGATIVE
Nitrite: NEGATIVE
Protein, ur: NEGATIVE mg/dL
Specific Gravity, Urine: 1.015 (ref 1.005–1.030)
pH: 7.5 (ref 5.0–8.0)

## 2021-03-17 MED ORDER — METRONIDAZOLE 0.75 % VA GEL: 1.0000 | Freq: Two times a day (BID) | VAGINAL | 0 refills | Status: AC

## 2021-03-17 NOTE — ED Triage Notes (Addendum)
Lower abdominal pain since Saturday, denies n/v/d, patient is 20 w 2d pregnant, G 2 P0 (miscarriage).  Patient was diagnosed with UTI last month and prescribed flagyl which she said she took 3 doses of and vomited each time.

## 2021-03-17 NOTE — ED Provider Notes (Signed)
Emergency Department Provider Note   I have reviewed the triage vital signs and the nursing notes.   HISTORY  Chief Complaint Abdominal Pain   HPI Tracey Elliott is a 35 y.o. female G3P0 presents to the ED at approximately 20 weeks with lower abdominal discomfort. Symptoms are worse with standing. No pain with laying down. She notes some mild vaginal discharge but denies any bleeding or passage of fluid. Pain is mild to moderate.  She notes a prior history of 2 miscarriages in the past.  She is not having any upper abdominal pain, vomiting, chest discomfort, shortness of breath symptoms.  No radiation of symptoms or other modifying factors.   History reviewed. No pertinent past medical history.  There are no problems to display for this patient.   Past Surgical History:  Procedure Laterality Date   TONSILLECTOMY      Allergies Flagyl [metronidazole]  History reviewed. No pertinent family history.  Social History Social History   Tobacco Use   Smoking status: Never   Smokeless tobacco: Never  Vaping Use   Vaping Use: Never used  Substance Use Topics   Alcohol use: No   Drug use: No    Review of Systems  Constitutional: No fever/chills Eyes: No visual changes. ENT: No sore throat. Cardiovascular: Denies chest pain. Respiratory: Denies shortness of breath. Gastrointestinal: Positive lower abdominal pain.  No nausea, no vomiting.  No diarrhea.  No constipation. Genitourinary: Negative for dysuria. Musculoskeletal: Negative for back pain. Skin: Negative for rash. Neurological: Negative for headaches, focal weakness or numbness.  10-point ROS otherwise negative.  ____________________________________________   PHYSICAL EXAM:  VITAL SIGNS: ED Triage Vitals  Enc Vitals Group     BP 03/17/21 0833 138/81     Pulse Rate 03/17/21 0833 79     Resp 03/17/21 0833 18     Temp 03/17/21 0833 98.3 F (36.8 C)     Temp Source 03/17/21 0833 Oral     SpO2  03/17/21 0833 100 %     Weight 03/17/21 0835 175 lb (79.4 kg)     Height 03/17/21 0835 5\' 3"  (1.6 m)   Constitutional: Alert and oriented. Well appearing and in no acute distress. Eyes: Conjunctivae are normal.  Head: Atraumatic. Nose: No congestion/rhinnorhea. Mouth/Throat: Mucous membranes are moist.   Neck: No stridor.   Cardiovascular: Normal rate, regular rhythm. Good peripheral circulation. Grossly normal heart sounds.   Respiratory: Normal respiratory effort.  No retractions. Lungs CTAB. Gastrointestinal: Soft and nontender. No distention.  Genitourinary: Exam performed with patient's verbal consent and nurse tech chaperone. Normal external genitalia. Cervix is visually closed. Moderate white discharge. No CMT. No bleeding.  Musculoskeletal: No lower extremity tenderness nor edema. No gross deformities of extremities. Neurologic:  Normal speech and language. No gross focal neurologic deficits are appreciated.  Skin:  Skin is warm, dry and intact. No rash noted.  ____________________________________________   LABS (all labs ordered are listed, but only abnormal results are displayed)  Labs Reviewed  WET PREP, GENITAL - Abnormal; Notable for the following components:      Result Value   Clue Cells Wet Prep HPF POC PRESENT (*)    WBC, Wet Prep HPF POC MODERATE (*)    All other components within normal limits  URINE CULTURE - Abnormal; Notable for the following components:   Culture MULTIPLE SPECIES PRESENT, SUGGEST RECOLLECTION (*)    All other components within normal limits  COMPREHENSIVE METABOLIC PANEL - Abnormal; Notable for the following components:  Sodium 134 (*)    Calcium 8.7 (*)    Albumin 2.8 (*)    Total Bilirubin 0.1 (*)    All other components within normal limits  CBC WITH DIFFERENTIAL/PLATELET - Abnormal; Notable for the following components:   RBC 3.65 (*)    Hemoglobin 10.9 (*)    HCT 31.1 (*)    Abs Immature Granulocytes 0.08 (*)    All other  components within normal limits  URINALYSIS, ROUTINE W REFLEX MICROSCOPIC  LIPASE, BLOOD  GC/CHLAMYDIA PROBE AMP ([place]) NOT AT [facility]   ____________________________________________  RADIOLOGY  None ____________________________________________   PROCEDURES  Procedure(s) performed:   Procedures  None  ____________________________________________   INITIAL IMPRESSION / ASSESSMENT AND PLAN / ED COURSE  Pertinent labs & imaging results that were available during my care of the patient were reviewed by me and considered in my medical decision making (see chart for details).   Patient presents to the emergency department with lower abdominal discomfort.  She is [redacted] weeks pregnant.  No vaginal bleeding or passage of fluid.  Plan for pelvic.  We will evaluate for bacterial vaginosis, urinary tract infection, PID (low suspicion.  Exam not consistent with appendicitis or acute cholecystitis.  Do not plan on emergent abdominal imaging during this ED visit.  Patient is well-appearing and not having cramping/contraction-like pain. Fetal heart tones obtained at the bedside and WNL.   Labs reviewed. Clue cells on wet prep. Will cover with topical flagyl given intolerance of PO metronidazole in the past. No UTI on UA. Will send for culture. Plan for close OB follow up.  ____________________________________________  FINAL CLINICAL IMPRESSION(S) / ED DIAGNOSES  Final diagnoses:  BV (bacterial vaginosis)     NEW OUTPATIENT MEDICATIONS STARTED DURING THIS VISIT:  Discharge Medication List as of 03/17/2021 10:23 AM     START taking these medications   Details  metroNIDAZOLE (METROGEL) 0.75 % vaginal gel Place 1 Applicatorful vaginally 2 (two) times daily for 7 days., Starting Mon 03/17/2021, Until Mon 03/24/2021, Normal        Note:  This document was prepared using Dragon voice recognition software and may include unintentional dictation errors.  [name], MD, [facility] Emergency  Medicine    Jerame Hedding, Arlyss Repress, MD 03/21/21 [phone]

## 2021-03-17 NOTE — Discharge Instructions (Signed)
You were seen in the emerge department today with vaginal discharge and abdominal discomfort and pregnancy.  You do have some bacterial vaginosis and I am treating you with topical Flagyl to keep you from developing nausea and vomiting.  Please follow closely with your OB/GYN by calling them on the phone today and scheduling an appointment for later this week.  Return with any vaginal bleeding or worsening abdominal pain.

## 2021-03-18 LAB — URINE CULTURE

## 2021-03-18 LAB — GC/CHLAMYDIA PROBE AMP (~~LOC~~) NOT AT ARMC
Chlamydia: NEGATIVE
Comment: NEGATIVE
Comment: NORMAL
Neisseria Gonorrhea: NEGATIVE

## 2022-01-28 ENCOUNTER — Emergency Department (HOSPITAL_BASED_OUTPATIENT_CLINIC_OR_DEPARTMENT_OTHER)
Admission: EM | Admit: 2022-01-28 | Discharge: 2022-01-28 | Disposition: A | Discharge status: Home / Self Care | Payer: Medicaid Other | Attending: Emergency Medicine | Admitting: Emergency Medicine

## 2022-01-28 ENCOUNTER — Other Ambulatory Visit: Payer: Self-pay

## 2022-01-28 ENCOUNTER — Encounter (HOSPITAL_BASED_OUTPATIENT_CLINIC_OR_DEPARTMENT_OTHER): Payer: Self-pay | Admitting: Emergency Medicine

## 2022-01-28 DIAGNOSIS — M542 Cervicalgia: Secondary | ICD-10-CM | POA: Insufficient documentation

## 2022-01-28 DIAGNOSIS — Y92410 Unspecified street and highway as the place of occurrence of the external cause: Secondary | ICD-10-CM | POA: Insufficient documentation

## 2022-01-28 DIAGNOSIS — R519 Headache, unspecified: Secondary | ICD-10-CM | POA: Diagnosis not present

## 2022-01-28 MED ORDER — KETOROLAC TROMETHAMINE 60 MG/2ML IM SOLN
60.0000 mg | Freq: Once | INTRAMUSCULAR | Status: AC
  Administered 2022-01-28: 60 mg via INTRAMUSCULAR
  Filled 2022-01-28: qty 2

## 2022-01-28 MED ORDER — METHOCARBAMOL 500 MG PO TABS: 500.0000 mg | ORAL_TABLET | Freq: Two times a day (BID) | ORAL | 0 refills | Status: AC

## 2022-01-28 NOTE — Discharge Instructions (Signed)
Take NSAIDs or Tylenol as needed for the next week. Take this medicine with food. Take muscle relaxer at bedtime to help you sleep. This medicine makes you drowsy so do not take before driving or work Use a heating pad for sore muscles - use for 20 minutes several times a day Try gentle range of motion exercises Return for worsening symptoms  
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
## 2023-01-06 IMAGING — US US OB LIMITED
2 series · 14 of 28 positions shown · non-contrast
Comparison: None.

CLINICAL DATA: Pregnant abdominal pain

EXAM:
OBSTETRIC <14 WK ULTRASOUND
TECHNIQUE: Transabdominal ultrasound was performed for evaluation of the
gestation as well as the maternal uterus and adnexal regions.

[Series 1: us ob limited · 30 acquisitions, 13 frames shown (1 of 2)]
[im 2/30]
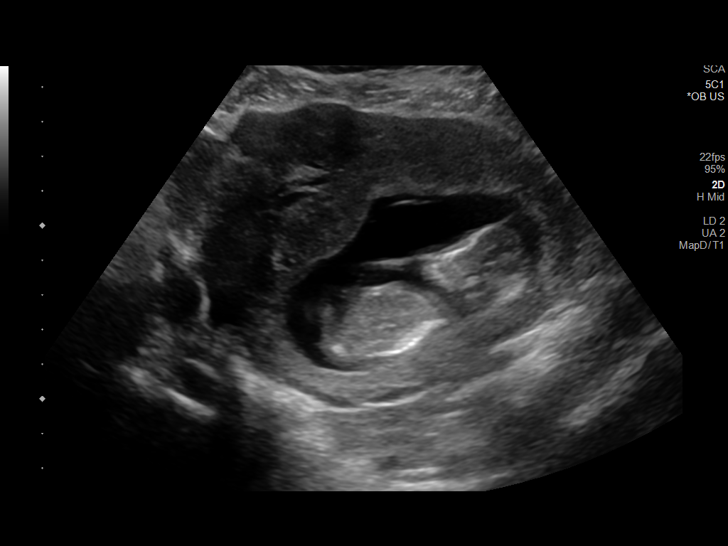
[im 4/30]
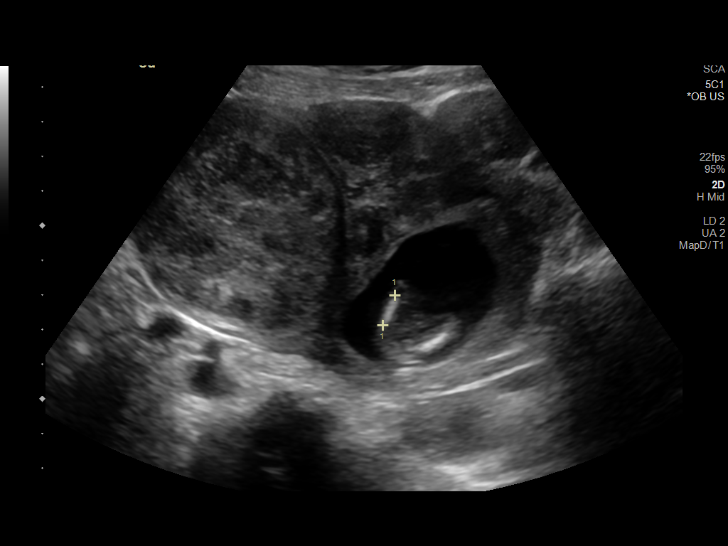
[im 6/30]
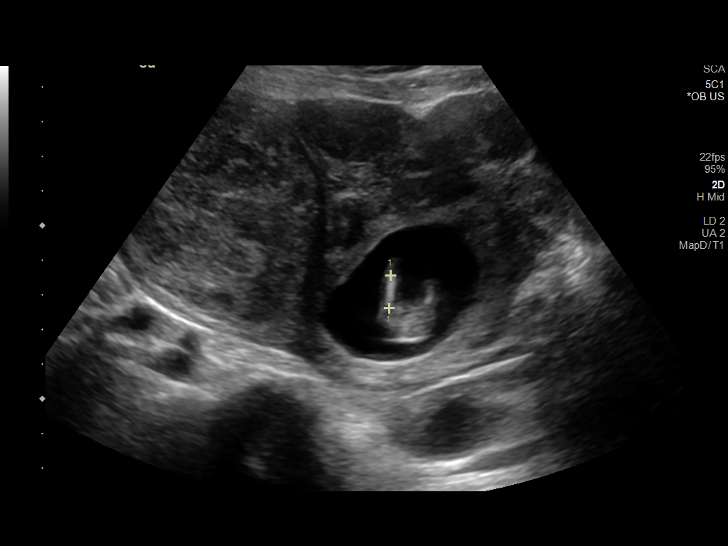
[im 8/30]
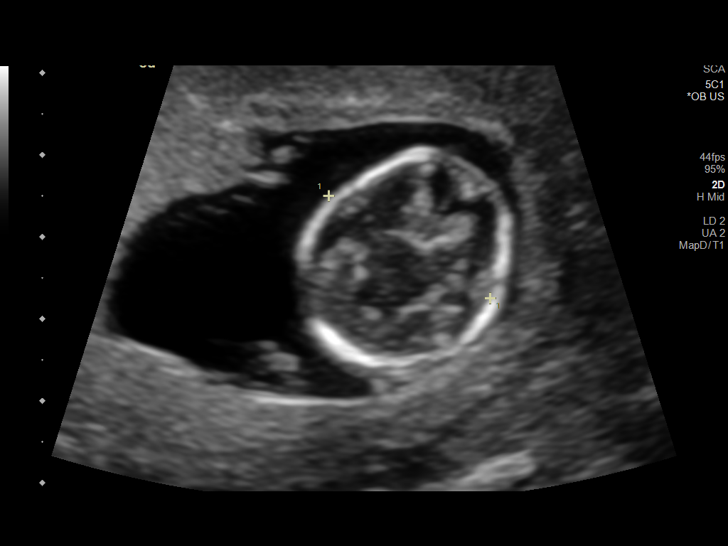
[im 11/30]
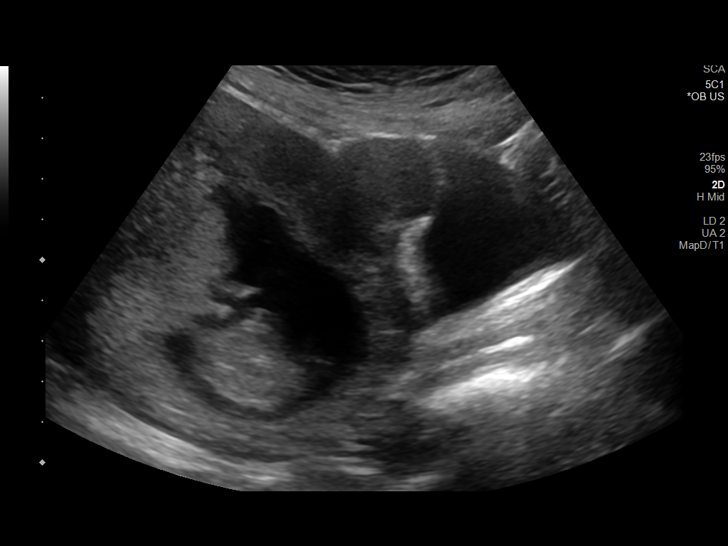
[im 13/30]
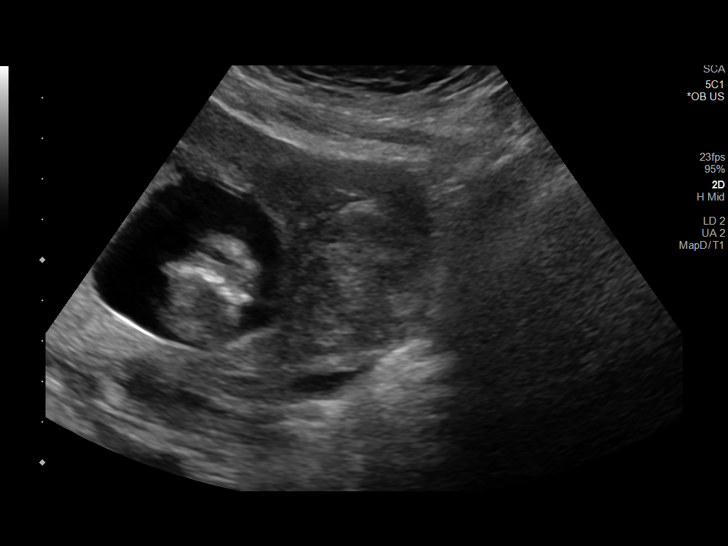
[im 15/30]
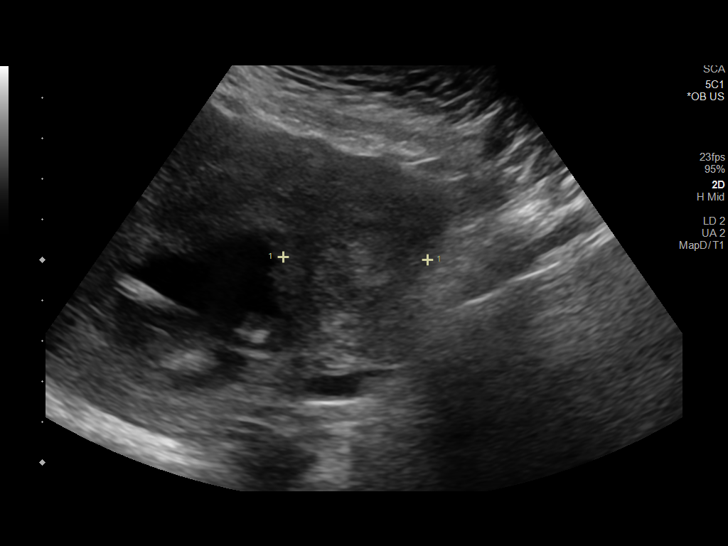
[im 17/30]
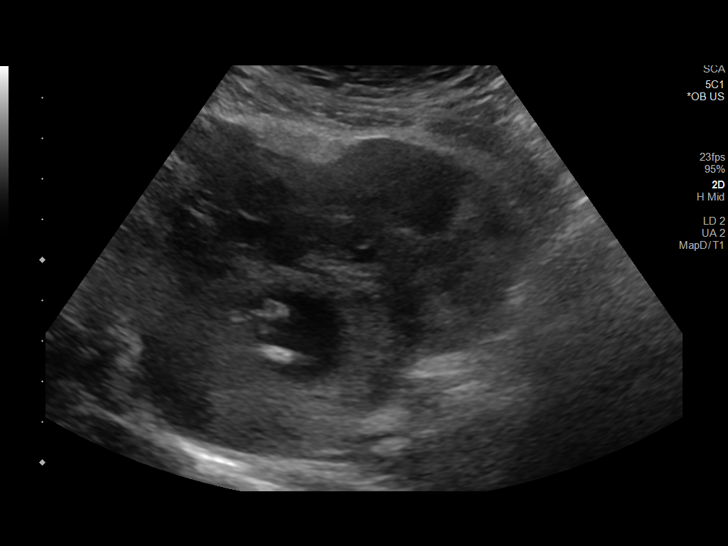
[im 19/30]
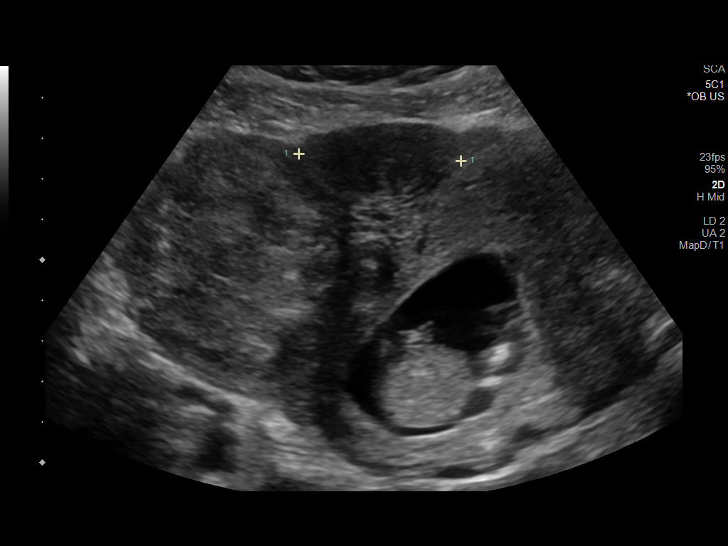
[im 22/30]
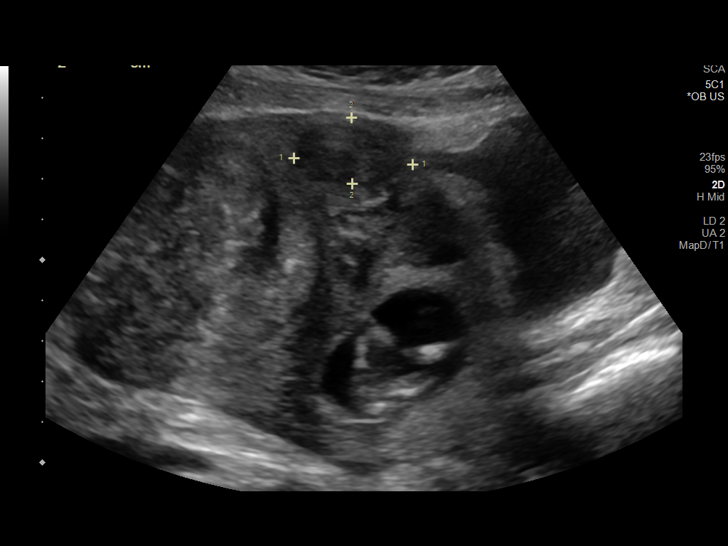
[im 24/30]
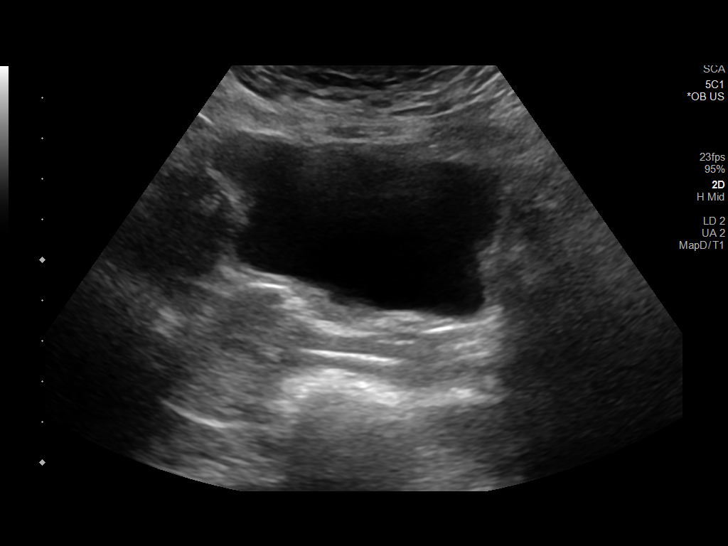
[im 26/30]
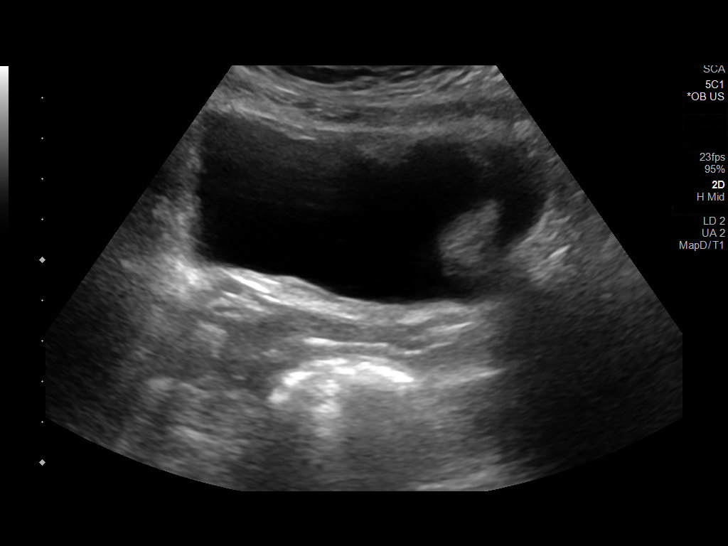
[im 28/30]
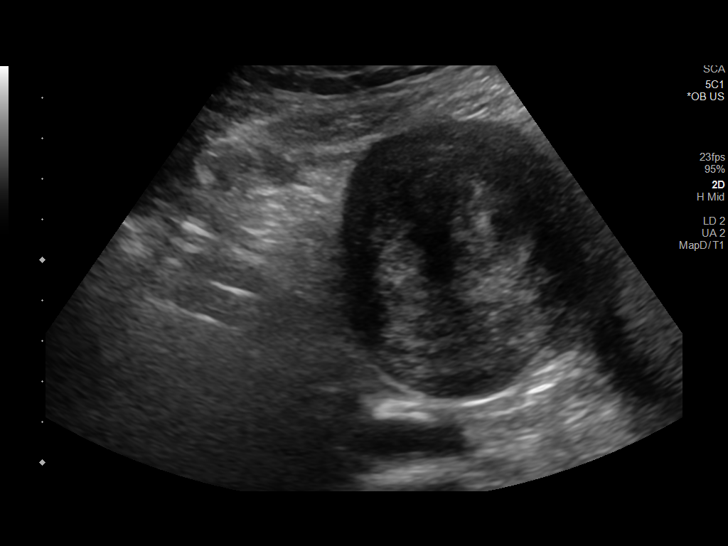

[Series 1002: us ob limited · 1 of 1 slices shown (2 of 2)]
[im 1/1]
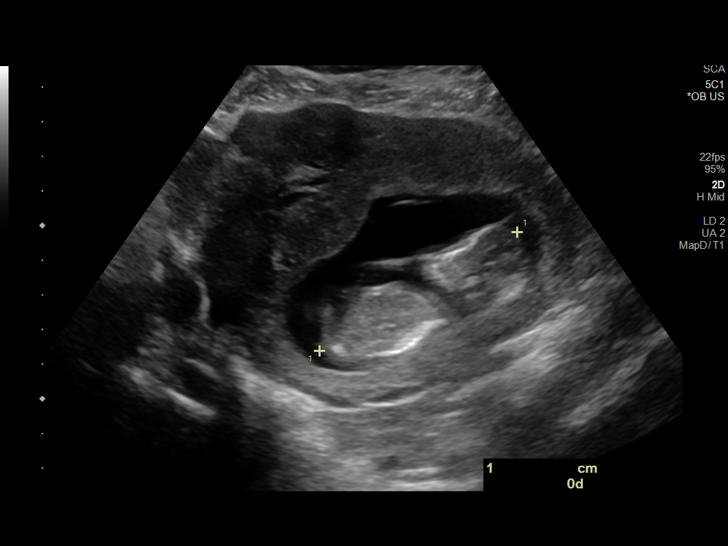

[14 of 28 positions shown; findings below may reference images not displayed]

FINDINGS: Intrauterine gestational sac: Single

Yolk sac:  Not Visualized.

Embryo:  Not Visualized.

Cardiac Activity: Not Visualized.

Heart Rate: 145 bpm

CRL:   66.5 mm   13 w 0 d                  [place] EDC: 07/28/2021

Subchorionic hemorrhage:  None visualized.

Maternal uterus/adnexae: Uterine fibroids.
IMPRESSION: 1. Single intrauterine gestation at sonographic gestational age 13
weeks, 0 days. Fetal heart rate 145 bpm. EDD 07/28/2021.

2.  Uterine fibroids.
